# Patient Record
Sex: Female | Born: 1980 | Race: White | Hispanic: No | Marital: Single | Smoking: Never smoker
Health system: Southern US, Community
[De-identification: ages and names within clinical notes are randomized; demographics above are authoritative.]

## PROBLEM LIST (undated history)

## (undated) DIAGNOSIS — T7840XA Allergy, unspecified, initial encounter: Secondary | ICD-10-CM

## (undated) DIAGNOSIS — N809 Endometriosis, unspecified: Secondary | ICD-10-CM

## (undated) DIAGNOSIS — G35D Multiple sclerosis, unspecified: Secondary | ICD-10-CM

## (undated) DIAGNOSIS — G35 Multiple sclerosis: Secondary | ICD-10-CM

## (undated) HISTORY — DX: Allergy, unspecified, initial encounter: T78.40XA

## (undated) HISTORY — PX: LAPAROSCOPIC ENDOMETRIOSIS FULGURATION: SUR769

## (undated) HISTORY — DX: Endometriosis, unspecified: N80.9

## (undated) HISTORY — DX: Multiple sclerosis, unspecified: G35.D

## (undated) HISTORY — DX: Multiple sclerosis: G35

---

## 2006-05-26 ENCOUNTER — Emergency Department: Payer: Self-pay | Admitting: Emergency Medicine

## 2008-06-26 ENCOUNTER — Emergency Department: Payer: Self-pay | Admitting: Unknown Physician Specialty

## 2009-07-01 ENCOUNTER — Emergency Department: Payer: Self-pay | Admitting: Emergency Medicine

## 2009-09-12 ENCOUNTER — Ambulatory Visit: Payer: Self-pay | Admitting: Nephrology

## 2010-05-09 ENCOUNTER — Ambulatory Visit: Payer: Self-pay

## 2010-05-15 ENCOUNTER — Ambulatory Visit: Payer: Self-pay

## 2010-05-16 LAB — PATHOLOGY REPORT

## 2015-10-31 ENCOUNTER — Other Ambulatory Visit: Payer: Self-pay | Admitting: Family

## 2015-10-31 ENCOUNTER — Ambulatory Visit
Admission: RE | Admit: 2015-10-31 | Discharge: 2015-10-31 | Disposition: A | Discharge status: Home / Self Care | Payer: Self-pay | Source: Ambulatory Visit | Attending: Family | Admitting: Family

## 2015-10-31 ENCOUNTER — Ambulatory Visit
Admission: RE | Admit: 2015-10-31 | Discharge: 2015-10-31 | Disposition: A | Discharge status: Home / Self Care | Payer: Worker's Compensation | Source: Ambulatory Visit | Attending: Family | Admitting: Family

## 2015-10-31 DIAGNOSIS — M25571 Pain in right ankle and joints of right foot: Secondary | ICD-10-CM | POA: Diagnosis present

## 2015-10-31 DIAGNOSIS — R52 Pain, unspecified: Secondary | ICD-10-CM

## 2018-02-18 ENCOUNTER — Ambulatory Visit (INDEPENDENT_AMBULATORY_CARE_PROVIDER_SITE_OTHER): Payer: BLUE CROSS/BLUE SHIELD | Admitting: Family Medicine

## 2018-02-18 ENCOUNTER — Encounter: Payer: Self-pay | Admitting: Family Medicine

## 2018-02-18 ENCOUNTER — Other Ambulatory Visit: Payer: Self-pay

## 2018-02-18 VITALS — BP 92/62 | HR 69 | Temp 98.3°F | Ht 65.5 in | Wt 154.6 lb

## 2018-02-18 DIAGNOSIS — J0100 Acute maxillary sinusitis, unspecified: Secondary | ICD-10-CM

## 2018-02-18 DIAGNOSIS — J301 Allergic rhinitis due to pollen: Secondary | ICD-10-CM | POA: Diagnosis not present

## 2018-02-18 DIAGNOSIS — N809 Endometriosis, unspecified: Secondary | ICD-10-CM | POA: Diagnosis not present

## 2018-02-18 DIAGNOSIS — J309 Allergic rhinitis, unspecified: Secondary | ICD-10-CM | POA: Insufficient documentation

## 2018-02-18 DIAGNOSIS — Z7689 Persons encountering health services in other specified circumstances: Secondary | ICD-10-CM

## 2018-02-18 MED ORDER — NORGESTIMATE-ETH ESTRADIOL 0.25-35 MG-MCG PO TABS: 1.0000 | ORAL_TABLET | Freq: Every day | ORAL | 11 refills | Status: DC

## 2018-02-18 MED ORDER — AMOXICILLIN-POT CLAVULANATE 875-125 MG PO TABS: 1.0000 | ORAL_TABLET | Freq: Two times a day (BID) | ORAL | 0 refills | Status: DC

## 2018-02-18 NOTE — Patient Instructions (Signed)
Follow up for CPE 

## 2018-02-18 NOTE — Assessment & Plan Note (Signed)
Long-standing issue, having significant daily pain that sometimes causes her to miss work. Has tried medication management in the past without success. Hoping for a hysterectomy in the near future but not ready to see GYN yet. She knows to call when she's ready for that. Will start birth control pills in attempt to lessen severity in meantime. Risks and benefits reviewed

## 2018-02-18 NOTE — Assessment & Plan Note (Signed)
Stable on allegra during pollen season. Add flonase prn, continue current regimen

## 2018-02-18 NOTE — Progress Notes (Signed)
BP 92/62   Pulse 69   Temp 98.3 F (36.8 C) (Oral)   Ht 5' 5.5" (1.664 m)   Wt 154 lb 9.6 oz (70.1 kg)   SpO2 99%   BMI 25.34 kg/m    Subjective:    Patient ID: Desiree Chavez, female    DOB: 1981-04-06, 37 y.o.   MRN: [id number]  HPI: Desiree Chavez is a 37 y.o. female  Chief Complaint  Patient presents with  . New Patient (Initial Visit)  . Sore Throat    x 1 week/ pressure nose, headache, ears and jaw pain  . Cough   1 week of sore throat, facial pain and pressure, congestion, sinus headache, jaw pain, ear pain b/l. Several days ago felt like his face was swollen. Trying ibuprofen and tylenol alternating, alka seltzer cold and sinus, and prn allegra. No sick contacts, never smoker.   Hasn't had a physical in over 15 years. Last pap was in 2012.   Hx of endometriosis, dx'd in 2003. Tried on lupron and one other medication she can't recall. Not interested in IUD or nexplanon. Continues to have significant daily pain, sometimes to the point of N/V and missing work. Hoping for a hysterectomy soon.   Past Medical History:  Diagnosis Date  . Endometriosis    Social History   Socioeconomic History  . Marital status: Single    Spouse name: Not on file  . Number of children: Not on file  . Years of education: Not on file  . Highest education level: Not on file  Occupational History  . Not on file  Social Needs  . Financial resource strain: Not on file  . Food insecurity:    Worry: Not on file    Inability: Not on file  . Transportation needs:    Medical: Not on file    Non-medical: Not on file  Tobacco Use  . Smoking status: Never Smoker  . Smokeless tobacco: Never Used  Substance and Sexual Activity  . Alcohol use: Never    Frequency: Never  . Drug use: Never  . Sexual activity: Not Currently  Lifestyle  . Physical activity:    Days per week: Not on file    Minutes per session: Not on file  . Stress: Not on file  Relationships  . Social  connections:    Talks on phone: Not on file    Gets together: Not on file    Attends religious service: Not on file    Active member of club or organization: Not on file    Attends meetings of clubs or organizations: Not on file    Relationship status: Not on file  . Intimate partner violence:    Fear of current or ex partner: Not on file    Emotionally abused: Not on file    Physically abused: Not on file    Forced sexual activity: Not on file  Other Topics Concern  . Not on file  Social History Narrative  . Not on file    Relevant past medical, surgical, family and social history reviewed and updated as indicated. Interim medical history since our last visit reviewed. Allergies and medications reviewed and updated.  Review of Systems  Per HPI unless specifically indicated above     Objective:    BP 92/62   Pulse 69   Temp 98.3 F (36.8 C) (Oral)   Ht 5' 5.5" (1.664 m)   Wt 154 lb 9.6 oz (70.1 kg)  SpO2 99%   BMI 25.34 kg/m   Wt Readings from Last 3 Encounters:  02/18/18 154 lb 9.6 oz (70.1 kg)    Physical Exam  Constitutional: She is oriented to person, place, and time. She appears well-developed and well-nourished. No distress.  HENT:  Head: Atraumatic.  Right Ear: External ear normal.  Left Ear: External ear normal.  Oropharynx and nasal mucosa erythematous with drainage present B/l maxillary sinuses ttp  Eyes: Conjunctivae and EOM are normal.  Neck: Normal range of motion. Neck supple.  Cardiovascular: Normal rate and regular rhythm.  Pulmonary/Chest: Effort normal and breath sounds normal.  Musculoskeletal: Normal range of motion.  Neurological: She is alert and oriented to person, place, and time.  Skin: Skin is warm and dry.  Psychiatric: She has a normal mood and affect. Her behavior is normal.  Nursing note and vitals reviewed.   No results found for this or any previous visit.    Assessment & Plan:   Problem List Items Addressed This Visit        Respiratory   Allergic rhinitis - Primary    Stable on allegra during pollen season. Add flonase prn, continue current regimen        Other   Endometriosis    Long-standing issue, having significant daily pain that sometimes causes her to miss work. Has tried medication management in the past without success. Hoping for a hysterectomy in the near future but not ready to see GYN yet. She knows to call when she's ready for that. Will start birth control pills in attempt to lessen severity in meantime. Risks and benefits reviewed       Other Visit Diagnoses    Encounter to establish care       Acute maxillary sinusitis, recurrence not specified       Start augmentin, flonase, mucinex, sinus rinses. F/u if no improvement   Relevant Medications   Fexofenadine HCl (ALLEGRA PO)   amoxicillin-clavulanate (AUGMENTIN) 875-125 MG tablet       Follow up plan: Return for CPE.

## 2018-03-23 ENCOUNTER — Ambulatory Visit (INDEPENDENT_AMBULATORY_CARE_PROVIDER_SITE_OTHER): Payer: BLUE CROSS/BLUE SHIELD | Admitting: Family Medicine

## 2018-03-23 ENCOUNTER — Other Ambulatory Visit (HOSPITAL_COMMUNITY)
Admission: RE | Admit: 2018-03-23 | Discharge: 2018-03-23 | Disposition: A | Discharge status: Home / Self Care | Payer: BLUE CROSS/BLUE SHIELD | Source: Ambulatory Visit | Attending: Family Medicine | Admitting: Family Medicine

## 2018-03-23 ENCOUNTER — Encounter: Payer: Self-pay | Admitting: Family Medicine

## 2018-03-23 ENCOUNTER — Other Ambulatory Visit: Payer: Self-pay

## 2018-03-23 VITALS — BP 91/62 | HR 69 | Temp 97.9°F | Ht 66.0 in | Wt 153.5 lb

## 2018-03-23 DIAGNOSIS — Z0000 Encounter for general adult medical examination without abnormal findings: Secondary | ICD-10-CM

## 2018-03-23 DIAGNOSIS — Z87898 Personal history of other specified conditions: Secondary | ICD-10-CM | POA: Diagnosis not present

## 2018-03-23 DIAGNOSIS — Z124 Encounter for screening for malignant neoplasm of cervix: Secondary | ICD-10-CM | POA: Insufficient documentation

## 2018-03-23 DIAGNOSIS — R202 Paresthesia of skin: Secondary | ICD-10-CM

## 2018-03-23 DIAGNOSIS — N809 Endometriosis, unspecified: Secondary | ICD-10-CM | POA: Diagnosis not present

## 2018-03-23 DIAGNOSIS — Z23 Encounter for immunization: Secondary | ICD-10-CM

## 2018-03-23 DIAGNOSIS — L821 Other seborrheic keratosis: Secondary | ICD-10-CM | POA: Diagnosis not present

## 2018-03-23 DIAGNOSIS — Z8742 Personal history of other diseases of the female genital tract: Secondary | ICD-10-CM

## 2018-03-23 LAB — UA/M W/RFLX CULTURE, ROUTINE
BILIRUBIN UA: NEGATIVE
GLUCOSE, UA: NEGATIVE
Ketones, UA: NEGATIVE
LEUKOCYTES UA: NEGATIVE
Nitrite, UA: NEGATIVE
PROTEIN UA: NEGATIVE
RBC, UA: NEGATIVE
Specific Gravity, UA: 1.02 (ref 1.005–1.030)
Urobilinogen, Ur: 0.2 mg/dL (ref 0.2–1.0)
pH, UA: 6 (ref 5.0–7.5)

## 2018-03-23 NOTE — Progress Notes (Signed)
BP 91/62   Pulse 69   Temp 97.9 F (36.6 C) (Oral)   Ht 5\' 6"  (1.676 m)   Wt 153 lb 8 oz (69.6 kg)   SpO2 99%   BMI 24.78 kg/m    Subjective:    Patient ID: Desiree Chavez, female    DOB: 1980-12-14, 37 y.o.   MRN: [id number]  HPI: ARAIYA Chavez is a 37 y.o. female presenting on 03/23/2018 for comprehensive medical examination. Current medical complaints include:see below  Several years of right sided numbness and burning that she states goes from face to toes. Nothing seems to make better or worse. States water hitting her skin on that side burns. No known injuries or Neurologic issues.   Also has a skin lesion on right upper back that she is worried about. Has been peeling off and bleeding and itching some lately as well as growing larger.   Notes improvement in endometriosis pain with addition of birth control pills. No concerns there.   Hx of abnormal pap smears x 2. Last pap was in 2012 per patient and abnormal, states she was not told to follow up further about this and that she quickly lost insurance so was lost to follow up.   Depression Screen done today and results listed below:  Depression screen [facility] 2/9 03/23/2018 02/18/2018  Decreased Interest 0 0  Down, Depressed, Hopeless 0 0  PHQ - 2 Score 0 0  Altered sleeping 0 0  Tired, decreased energy 0 0  Change in appetite 0 0  Feeling bad or failure about yourself  0 0  Trouble concentrating 0 0  Moving slowly or fidgety/restless 0 0  Suicidal thoughts 0 0  PHQ-9 Score 0 0    The patient does not have a history of falls. I did not complete a risk assessment for falls. A plan of care for falls was not documented.   Past Medical History:  Past Medical History:  Diagnosis Date  . Endometriosis     Surgical History:  Past Surgical History:  Procedure Laterality Date  . LAPAROSCOPIC ENDOMETRIOSIS FULGURATION N/A     Medications:  Current Outpatient Medications on File Prior to Visit  Medication Sig    . Fexofenadine HCl (ALLEGRA PO) Take by mouth as needed.  Marland Kitchen ibuprofen (ADVIL,MOTRIN) 600 MG tablet Take by mouth.  . norgestimate-ethinyl estradiol (ORTHO-CYCLEN, 28,) 0.25-35 MG-MCG tablet Take 1 tablet by mouth daily.   No current facility-administered medications on file prior to visit.     Allergies:  Allergies  Allergen Reactions  . Codeine Rash    Social History:  Social History   Socioeconomic History  . Marital status: Single    Spouse name: Not on file  . Number of children: Not on file  . Years of education: Not on file  . Highest education level: Not on file  Occupational History  . Not on file  Social Needs  . Financial resource strain: Not on file  . Food insecurity:    Worry: Not on file    Inability: Not on file  . Transportation needs:    Medical: Not on file    Non-medical: Not on file  Tobacco Use  . Smoking status: Never Smoker  . Smokeless tobacco: Never Used  Substance and Sexual Activity  . Alcohol use: Never    Frequency: Never  . Drug use: Never  . Sexual activity: Not Currently  Lifestyle  . Physical activity:    Days per week: Not  on file    Minutes per session: Not on file  . Stress: Not on file  Relationships  . Social connections:    Talks on phone: Not on file    Gets together: Not on file    Attends religious service: Not on file    Active member of club or organization: Not on file    Attends meetings of clubs or organizations: Not on file    Relationship status: Not on file  . Intimate partner violence:    Fear of current or ex partner: Not on file    Emotionally abused: Not on file    Physically abused: Not on file    Forced sexual activity: Not on file  Other Topics Concern  . Not on file  Social History Narrative  . Not on file   Social History   Tobacco Use  Smoking Status Never Smoker  Smokeless Tobacco Never Used   Social History   Substance and Sexual Activity  Alcohol Use Never  . Frequency: Never     Family History:  Family History  Problem Relation Age of Onset  . Diabetes Mother   . Hypertension Mother   . Hyperlipidemia Mother   . Depression Mother   . Hypertension Father   . Hyperlipidemia Father   . Depression Father   . Stroke Father   . Bipolar disorder Father   . Bartter's syndrome Sister   . Heart disease Maternal Grandmother   . COPD Maternal Grandmother   . Diabetes Maternal Grandmother   . Cancer Maternal Grandfather     Past medical history, surgical history, medications, allergies, family history and social history reviewed with patient today and changes made to appropriate areas of the chart.   Review of Systems - General ROS: negative Psychological ROS: negative Ophthalmic ROS: negative ENT ROS: negative Allergy and Immunology ROS: negative Hematological and Lymphatic ROS: negative Endocrine ROS: negative Breast ROS: negative for breast lumps Respiratory ROS: no cough, shortness of breath, or wheezing Cardiovascular ROS: no chest pain or dyspnea on exertion Gastrointestinal ROS: no abdominal pain, change in bowel habits, or black or bloody stools Genito-Urinary ROS: no dysuria, trouble voiding, or hematuria Musculoskeletal ROS: negative Neurological ROS: positive for - numbness/tingling Dermatological ROS: positive for skin lesion changes All other ROS negative except what is listed above and in the HPI.      Objective:    BP 91/62   Pulse 69   Temp 97.9 F (36.6 C) (Oral)   Ht 5\' 6"  (1.676 m)   Wt 153 lb 8 oz (69.6 kg)   SpO2 99%   BMI 24.78 kg/m   Wt Readings from Last 3 Encounters:  03/23/18 153 lb 8 oz (69.6 kg)  02/18/18 154 lb 9.6 oz (70.1 kg)    Physical Exam  Constitutional: She is oriented to person, place, and time. She appears well-developed and well-nourished. No distress.  HENT:  Head: Atraumatic.  Right Ear: External ear normal.  Left Ear: External ear normal.  Nose: Nose normal.  Mouth/Throat: Oropharynx is clear  and moist. No oropharyngeal exudate.  Eyes: Pupils are equal, round, and reactive to light. Conjunctivae are normal. No scleral icterus.  Neck: Normal range of motion. Neck supple. No thyromegaly present.  Cardiovascular: Normal rate, regular rhythm, normal heart sounds and intact distal pulses.  Pulmonary/Chest: Effort normal and breath sounds normal. No respiratory distress. Right breast exhibits no mass, no nipple discharge, no skin change and no tenderness. Left breast exhibits no mass, no  nipple discharge, no skin change and no tenderness.  Abdominal: Soft. Bowel sounds are normal. She exhibits no mass. There is no tenderness.  Genitourinary:  Genitourinary Comments: Cervix minimally erythematous Vaginal mucosa benign No discharge present  Musculoskeletal: Normal range of motion. She exhibits no edema or tenderness.  Strength full and equal b/l UE/LE  Lymphadenopathy:    She has no cervical adenopathy.    She has no axillary adenopathy.  Neurological: She is alert and oriented to person, place, and time. She displays normal reflexes. A sensory deficit (right face, UE, LE decreased sensation to light touch) is present. She exhibits normal muscle tone.  Skin: Skin is warm and dry. No rash noted.  Seborrheic keratosis right upper back, some evidence of recent bleeding and irritation but otherwise benign  Psychiatric: She has a normal mood and affect. Her behavior is normal.  Nursing note and vitals reviewed.   Results for orders placed or performed in visit on 03/23/18  CBC with Differential/Platelet  Result Value Ref Range   WBC 5.2 3.4 - 10.8 x10E3/uL   RBC 4.16 3.77 - 5.28 x10E6/uL   Hemoglobin 11.9 11.1 - 15.9 g/dL   Hematocrit 37.1 34.0 - 46.6 %   MCV 89 79 - 97 fL   MCH 28.6 26.6 - 33.0 pg   MCHC 32.1 31.5 - 35.7 g/dL   RDW 12.4 12.3 - 15.4 %   Platelets 237 150 - 450 x10E3/uL   Neutrophils 63 Not Estab. %   Lymphs 28 Not Estab. %   Monocytes 6 Not Estab. %   Eos 2 Not  Estab. %   Basos 1 Not Estab. %   Neutrophils Absolute 3.3 1.4 - 7.0 x10E3/uL   Lymphocytes Absolute 1.5 0.7 - 3.1 x10E3/uL   Monocytes Absolute 0.3 0.1 - 0.9 x10E3/uL   EOS (ABSOLUTE) 0.1 0.0 - 0.4 x10E3/uL   Basophils Absolute 0.0 0.0 - 0.2 x10E3/uL   Immature Granulocytes 0 Not Estab. %   Immature Grans (Abs) 0.0 0.0 - 0.1 x10E3/uL  Comprehensive metabolic panel  Result Value Ref Range   Glucose 98 65 - 99 mg/dL   BUN 8 6 - 20 mg/dL   Creatinine, Ser 0.85 0.57 - 1.00 mg/dL   GFR calc non Af Amer 87 >59 mL/min/1.73   GFR calc Af Amer 100 >59 mL/min/1.73   BUN/Creatinine Ratio 9 9 - 23   Sodium 139 134 - 144 mmol/L   Potassium 4.1 3.5 - 5.2 mmol/L   Chloride 102 96 - 106 mmol/L   CO2 24 20 - 29 mmol/L   Calcium 9.3 8.7 - 10.2 mg/dL   Total Protein 7.4 6.0 - 8.5 g/dL   Albumin 4.5 3.5 - 5.5 g/dL   Globulin, Total 2.9 1.5 - 4.5 g/dL   Albumin/Globulin Ratio 1.6 1.2 - 2.2   Bilirubin Total 0.9 0.0 - 1.2 mg/dL   Alkaline Phosphatase 31 (L) 39 - 117 IU/L   AST 14 0 - 40 IU/L   ALT 11 0 - 32 IU/L  Lipid Panel w/o Chol/HDL Ratio  Result Value Ref Range   Cholesterol, Total 162 100 - 199 mg/dL   Triglycerides 85 0 - 149 mg/dL   HDL 55 >39 mg/dL   VLDL Cholesterol Cal 17 5 - 40 mg/dL   LDL Calculated 90 0 - 99 mg/dL  TSH  Result Value Ref Range   TSH 1.090 0.450 - 4.500 uIU/mL  UA/M w/rflx Culture, Routine  Result Value Ref Range   Specific Gravity, UA 1.020  1.005 - 1.030   pH, UA 6.0 5.0 - 7.5   Color, UA Yellow Yellow   Appearance Ur Cloudy (A) Clear   Leukocytes, UA Negative Negative   Protein, UA Negative Negative/Trace   Glucose, UA Negative Negative   Ketones, UA Negative Negative   RBC, UA Negative Negative   Bilirubin, UA Negative Negative   Urobilinogen, Ur 0.2 0.2 - 1.0 mg/dL   Nitrite, UA Negative Negative      Assessment & Plan:   Problem List Items Addressed This Visit      Other   Endometriosis    Improved with oral contraceptives. Continue current  regimen       Other Visit Diagnoses    Paresthesia    -  Primary   Will refer to Neurology for further workup. Ongoing issue for several years   Relevant Orders   Ambulatory referral to Neurology   Annual physical exam       Relevant Orders   CBC with Differential/Platelet (Completed)   Comprehensive metabolic panel (Completed)   Lipid Panel w/o Chol/HDL Ratio (Completed)   TSH (Completed)   UA/M w/rflx Culture, Routine (Completed)   Flu vaccine need       Relevant Orders   Flu Vaccine QUAD 36+ mos IM (Completed)   Screening for cervical cancer       Relevant Orders   Cytology - PAP   History of abnormal cervical Pap smear       Relevant Orders   Cytology - PAP   Seborrheic keratosis       Reassurance given about benign nature. Options of cryotherapy vs shave excision given if bothersome. Pt will leave it alone for now       Follow up plan: Return in about 1 year (around 03/24/2019) for CPE.   LABORATORY TESTING:  - Pap smear: pap done  IMMUNIZATIONS:   - Tdap: Tetanus vaccination status reviewed: will get at next CPE. - Influenza: Administered today  PATIENT COUNSELING:   Advised to take 1 mg of folate supplement per day if capable of pregnancy.   Sexuality: Discussed sexually transmitted diseases, partner selection, use of condoms, avoidance of unintended pregnancy  and contraceptive alternatives.   Advised to avoid cigarette smoking.  I discussed with the patient that most people either abstain from alcohol or drink within safe limits (<=14/week and <=4 drinks/occasion for males, <=7/weeks and <= 3 drinks/occasion for females) and that the risk for alcohol disorders and other health effects rises proportionally with the number of drinks per week and how often a drinker exceeds daily limits.  Discussed cessation/primary prevention of drug use and availability of treatment for abuse.   Diet: Encouraged to adjust caloric intake to maintain  or achieve ideal body  weight, to reduce intake of dietary saturated fat and total fat, to limit sodium intake by avoiding high sodium foods and not adding table salt, and to maintain adequate dietary potassium and calcium preferably from fresh fruits, vegetables, and low-fat dairy products.    stressed the importance of regular exercise  Injury prevention: Discussed safety belts, safety helmets, smoke detector, smoking near bedding or upholstery.   Dental health: Discussed importance of regular tooth brushing, flossing, and dental visits.    NEXT PREVENTATIVE PHYSICAL DUE IN 1 YEAR. Return in about 1 year (around 03/24/2019) for CPE.

## 2018-03-24 LAB — CBC WITH DIFFERENTIAL/PLATELET
BASOS ABS: 0 10*3/uL (ref 0.0–0.2)
Basos: 1 %
EOS (ABSOLUTE): 0.1 10*3/uL (ref 0.0–0.4)
Eos: 2 %
HEMOGLOBIN: 11.9 g/dL (ref 11.1–15.9)
Hematocrit: 37.1 % (ref 34.0–46.6)
IMMATURE GRANS (ABS): 0 10*3/uL (ref 0.0–0.1)
Immature Granulocytes: 0 %
Lymphocytes Absolute: 1.5 10*3/uL (ref 0.7–3.1)
Lymphs: 28 %
MCH: 28.6 pg (ref 26.6–33.0)
MCHC: 32.1 g/dL (ref 31.5–35.7)
MCV: 89 fL (ref 79–97)
MONOCYTES: 6 %
Monocytes Absolute: 0.3 10*3/uL (ref 0.1–0.9)
Neutrophils Absolute: 3.3 10*3/uL (ref 1.4–7.0)
Neutrophils: 63 %
Platelets: 237 10*3/uL (ref 150–450)
RBC: 4.16 x10E6/uL (ref 3.77–5.28)
RDW: 12.4 % (ref 12.3–15.4)
WBC: 5.2 10*3/uL (ref 3.4–10.8)

## 2018-03-24 LAB — LIPID PANEL W/O CHOL/HDL RATIO
CHOLESTEROL TOTAL: 162 mg/dL (ref 100–199)
HDL: 55 mg/dL (ref >39)
LDL Calculated: 90 mg/dL (ref 0–99)
Triglycerides: 85 mg/dL (ref 0–149)
VLDL CHOLESTEROL CAL: 17 mg/dL (ref 5–40)

## 2018-03-24 LAB — COMPREHENSIVE METABOLIC PANEL
ALBUMIN: 4.5 g/dL (ref 3.5–5.5)
ALK PHOS: 31 IU/L — AB (ref 39–117)
ALT: 11 IU/L (ref 0–32)
AST: 14 IU/L (ref 0–40)
Albumin/Globulin Ratio: 1.6 (ref 1.2–2.2)
BUN / CREAT RATIO: 9 (ref 9–23)
BUN: 8 mg/dL (ref 6–20)
Bilirubin Total: 0.9 mg/dL (ref 0.0–1.2)
CO2: 24 mmol/L (ref 20–29)
Calcium: 9.3 mg/dL (ref 8.7–10.2)
Chloride: 102 mmol/L (ref 96–106)
Creatinine, Ser: 0.85 mg/dL (ref 0.57–1.00)
GFR calc Af Amer: 100 mL/min/{1.73_m2} (ref >59)
GFR calc non Af Amer: 87 mL/min/{1.73_m2} (ref >59)
GLUCOSE: 98 mg/dL (ref 65–99)
Globulin, Total: 2.9 g/dL (ref 1.5–4.5)
Potassium: 4.1 mmol/L (ref 3.5–5.2)
Sodium: 139 mmol/L (ref 134–144)
TOTAL PROTEIN: 7.4 g/dL (ref 6.0–8.5)

## 2018-03-24 LAB — TSH: TSH: 1.09 u[IU]/mL (ref 0.450–4.500)

## 2018-03-25 NOTE — Assessment & Plan Note (Signed)
Improved with oral contraceptives. Continue current regimen

## 2018-03-25 NOTE — Patient Instructions (Signed)
Follow up in 1 year.

## 2018-03-28 ENCOUNTER — Telehealth: Payer: Self-pay | Admitting: Family Medicine

## 2018-03-28 LAB — CYTOLOGY - PAP
Diagnosis: NEGATIVE
HPV (WINDOPATH): NOT DETECTED

## 2018-03-28 MED ORDER — AMOXICILLIN-POT CLAVULANATE 875-125 MG PO TABS: 1.0000 | ORAL_TABLET | Freq: Two times a day (BID) | ORAL | 0 refills | Status: DC

## 2018-03-28 NOTE — Telephone Encounter (Signed)
Rx sent  Copied from [place] [id number]. Topic: General - Other >> Mar 28, 2018  2:48 PM Yvette Rack wrote: Reason for CRM: pt Mother Izora Gala calling to see if [facility] can write a RX for amoxicillin-clavulanate (AUGMENTIN) [phone] she states that she has the same symptoms as she did on 02-18-18 with facial pain with her face and nose hurting and facial swelling with headaches please call mother Izora Gala at [facility] [address] ([address]), [place] [phone] (Phone) [phone] (Fax)

## 2018-03-29 NOTE — Telephone Encounter (Signed)
VM left for mother Desiree Chavez. DPR reviewed.

## 2018-03-31 DIAGNOSIS — R52 Pain, unspecified: Secondary | ICD-10-CM | POA: Insufficient documentation

## 2018-03-31 DIAGNOSIS — R200 Anesthesia of skin: Secondary | ICD-10-CM | POA: Diagnosis not present

## 2018-03-31 DIAGNOSIS — R202 Paresthesia of skin: Secondary | ICD-10-CM | POA: Diagnosis not present

## 2018-04-04 ENCOUNTER — Ambulatory Visit: Payer: BLUE CROSS/BLUE SHIELD | Admitting: Family Medicine

## 2018-06-08 ENCOUNTER — Ambulatory Visit: Payer: BLUE CROSS/BLUE SHIELD | Admitting: Family Medicine

## 2018-08-17 ENCOUNTER — Emergency Department
Admission: EM | Admit: 2018-08-17 | Discharge: 2018-08-17 | Disposition: A | Discharge status: Home / Self Care | Payer: BLUE CROSS/BLUE SHIELD | Attending: Emergency Medicine | Admitting: Emergency Medicine

## 2018-08-17 ENCOUNTER — Other Ambulatory Visit: Payer: Self-pay

## 2018-08-17 ENCOUNTER — Encounter: Payer: Self-pay | Admitting: Emergency Medicine

## 2018-08-17 DIAGNOSIS — Z711 Person with feared health complaint in whom no diagnosis is made: Secondary | ICD-10-CM | POA: Diagnosis not present

## 2018-08-17 DIAGNOSIS — M21372 Foot drop, left foot: Secondary | ICD-10-CM | POA: Diagnosis not present

## 2018-08-17 DIAGNOSIS — R4701 Aphasia: Secondary | ICD-10-CM | POA: Diagnosis not present

## 2018-08-17 DIAGNOSIS — M6281 Muscle weakness (generalized): Secondary | ICD-10-CM | POA: Diagnosis not present

## 2018-08-17 DIAGNOSIS — R4781 Slurred speech: Secondary | ICD-10-CM | POA: Diagnosis not present

## 2018-08-17 NOTE — ED Provider Notes (Signed)
[facility] Emergency Department Provider Note   ____________________________________________   First MD Initiated Contact with Patient 08/17/18 1109     (approximate)  I have reviewed the triage vital signs and the nursing notes.   HISTORY  Chief Complaint Foreign Body in Vagina   HPI Desiree Chavez is a 38 y.o. female presents to the ED thinking that she has a tampon in her vagina that she is unable to remove.  Patient states that she put 1 AM at 7 AM and states she is unable to find the string to remove it.   Denies any pain.   Past Medical History:  Diagnosis Date  . Endometriosis     Patient Active Problem List   Diagnosis Date Noted  . Allergic rhinitis 02/18/2018  . Endometriosis 02/18/2018    Past Surgical History:  Procedure Laterality Date  . LAPAROSCOPIC ENDOMETRIOSIS FULGURATION N/A     Prior to Admission medications   Medication Sig Start Date End Date Taking? Authorizing Provider  amoxicillin-clavulanate (AUGMENTIN) 875-125 MG tablet Take 1 tablet by mouth 2 (two) times daily. 03/28/18   Volney American, PA-C  Fexofenadine HCl (ALLEGRA PO) Take by mouth as needed.    [provider]  ibuprofen (ADVIL,MOTRIN) 600 MG tablet Take by mouth.    [provider]  norgestimate-ethinyl estradiol (ORTHO-CYCLEN, 28,) 0.25-35 MG-MCG tablet Take 1 tablet by mouth daily. 02/18/18   Volney American, PA-C    Allergies Codeine  Family History  Problem Relation Age of Onset  . Diabetes Mother   . Hypertension Mother   . Hyperlipidemia Mother   . Depression Mother   . Hypertension Father   . Hyperlipidemia Father   . Depression Father   . Stroke Father   . Bipolar disorder Father   . Bartter's syndrome Sister   . Heart disease Maternal Grandmother   . COPD Maternal Grandmother   . Diabetes Maternal Grandmother   . Cancer Maternal Grandfather     Social History Social History   Tobacco Use  .  Smoking status: Never Smoker  . Smokeless tobacco: Never Used  Substance Use Topics  . Alcohol use: Never    Frequency: Never  . Drug use: Never    Review of Systems Constitutional: No fever/chills Cardiovascular: Denies chest pain. Respiratory: Denies shortness of breath. Genitourinary: Possible foreign body vagina. Skin: Negative for rash. Neurological: Negative for focal weakness or numbness. ___________________________________________   PHYSICAL EXAM:  VITAL SIGNS: ED Triage Vitals  Enc Vitals Group     BP 08/17/18 1044 114/69     Pulse Rate 08/17/18 1044 (!) 103     Resp 08/17/18 1044 20     Temp 08/17/18 1044 98.3 F (36.8 C)     Temp Source 08/17/18 1044 Oral     SpO2 08/17/18 1044 100 %     Weight 08/17/18 1045 162 lb (73.5 kg)     Height 08/17/18 1045 5\' 6"  (1.676 m)     Head Circumference --      Peak Flow --      Pain Score 08/17/18 1045 0     Pain Loc --      Pain Edu? --      Excl. in [place]? --    Constitutional: Alert and oriented. Well appearing and in no acute distress.  Tearful. Eyes: Conjunctivae are normal.  Head: Atraumatic. Neck: No stridor.   Cardiovascular: Normal rate, regular rhythm. Grossly normal heart sounds.  Good peripheral circulation. Respiratory:  Normal respiratory effort.  No retractions. Lungs CTAB. Gastrointestinal: Soft and nontender. No distention. Genitourinary: Normal external genitalia.  Vaginal exam no foreign body was noted.  Vaginal blood was removed from the area and a third visual exam was done.  Bimanual exam was without foreign body and no adnexal masses or tenderness was noted. Musculoskeletal: No lower extremity tenderness nor edema.  No joint effusions. Neurologic:  Normal speech and language. No gross focal neurologic deficits are appreciated. No gait instability. Skin:  Skin is warm, dry and intact. No rash noted. Psychiatric: Mood and affect are normal. Speech and behavior are  normal.  ____________________________________________   LABS (all labs ordered are listed, but only abnormal results are displayed)  Labs Reviewed - No data to display   PROCEDURES  Procedure(s) performed: None  Procedures  Critical Care performed: No  ____________________________________________   INITIAL IMPRESSION / ASSESSMENT AND PLAN / ED COURSE  As part of my medical decision making, I reviewed the following data within the electronic MEDICAL RECORD NUMBER Notes from prior ED visits and [place] Controlled Substance Database  Patient presents to the ED with concerns that she is unable to get a tampon out that she placed at 7 AM this morning.  On physical exam there was no tampon noted in the vaginal vault.  No foreign body was noted with bimanual exam.  Patient was reassured.  Patient was discharged with reassurance.   ____________________________________________   FINAL CLINICAL IMPRESSION(S) / ED DIAGNOSES  Final diagnoses:  Feared condition not demonstrated     ED Discharge Orders    None       Note:  This document was prepared using Dragon voice recognition software and may include unintentional dictation errors.    Johnn Hai, PA-C 08/17/18 [place], [place], MD 08/17/18 [phone]

## 2018-08-17 NOTE — Discharge Instructions (Addendum)
No foreign body was noted during the exam.  Follow-up with your primary care provider if any continued problems or concerns.

## 2018-08-17 NOTE — ED Triage Notes (Signed)
Patient has tampon in vagina inserted at 0700 this AM ,states she cannot remove it.

## 2018-08-18 ENCOUNTER — Emergency Department: Payer: Self-pay

## 2018-08-18 ENCOUNTER — Other Ambulatory Visit: Payer: Self-pay

## 2018-08-18 ENCOUNTER — Inpatient Hospital Stay
Admission: EM | Admit: 2018-08-18 | Discharge: 2018-08-23 | DRG: 059 | Disposition: A | Discharge status: Home / Self Care | Payer: Self-pay | Attending: Internal Medicine | Admitting: Internal Medicine

## 2018-08-18 DIAGNOSIS — M21372 Foot drop, left foot: Secondary | ICD-10-CM | POA: Diagnosis present

## 2018-08-18 DIAGNOSIS — R471 Dysarthria and anarthria: Secondary | ICD-10-CM | POA: Diagnosis present

## 2018-08-18 DIAGNOSIS — Z885 Allergy status to narcotic agent status: Secondary | ICD-10-CM

## 2018-08-18 DIAGNOSIS — Z820 Family history of epilepsy and other diseases of the nervous system: Secondary | ICD-10-CM

## 2018-08-18 DIAGNOSIS — G35 Multiple sclerosis: Principal | ICD-10-CM | POA: Diagnosis present

## 2018-08-18 DIAGNOSIS — R4781 Slurred speech: Secondary | ICD-10-CM | POA: Diagnosis not present

## 2018-08-18 DIAGNOSIS — Z833 Family history of diabetes mellitus: Secondary | ICD-10-CM

## 2018-08-18 DIAGNOSIS — R4701 Aphasia: Secondary | ICD-10-CM | POA: Diagnosis present

## 2018-08-18 DIAGNOSIS — G8314 Monoplegia of lower limb affecting left nondominant side: Secondary | ICD-10-CM | POA: Diagnosis present

## 2018-08-18 DIAGNOSIS — Z823 Family history of stroke: Secondary | ICD-10-CM

## 2018-08-18 DIAGNOSIS — Z818 Family history of other mental and behavioral disorders: Secondary | ICD-10-CM

## 2018-08-18 DIAGNOSIS — J309 Allergic rhinitis, unspecified: Secondary | ICD-10-CM | POA: Diagnosis present

## 2018-08-18 DIAGNOSIS — R29898 Other symptoms and signs involving the musculoskeletal system: Secondary | ICD-10-CM

## 2018-08-18 DIAGNOSIS — M6281 Muscle weakness (generalized): Secondary | ICD-10-CM | POA: Diagnosis not present

## 2018-08-18 DIAGNOSIS — Z79899 Other long term (current) drug therapy: Secondary | ICD-10-CM

## 2018-08-18 DIAGNOSIS — Z793 Long term (current) use of hormonal contraceptives: Secondary | ICD-10-CM

## 2018-08-18 DIAGNOSIS — E876 Hypokalemia: Secondary | ICD-10-CM | POA: Diagnosis present

## 2018-08-18 DIAGNOSIS — Z8249 Family history of ischemic heart disease and other diseases of the circulatory system: Secondary | ICD-10-CM

## 2018-08-18 DIAGNOSIS — Z8349 Family history of other endocrine, nutritional and metabolic diseases: Secondary | ICD-10-CM

## 2018-08-18 LAB — CBC
HCT: 37.7 % (ref 36.0–46.0)
Hemoglobin: 12.5 g/dL (ref 12.0–15.0)
MCH: 29.1 pg (ref 26.0–34.0)
MCHC: 33.2 g/dL (ref 30.0–36.0)
MCV: 87.7 fL (ref 80.0–100.0)
PLATELETS: 208 10*3/uL (ref 150–400)
RBC: 4.3 MIL/uL (ref 3.87–5.11)
RDW: 12.6 % (ref 11.5–15.5)
WBC: 6.3 10*3/uL (ref 4.0–10.5)
nRBC: 0 % (ref 0.0–0.2)

## 2018-08-18 LAB — COMPREHENSIVE METABOLIC PANEL
ALT: 18 U/L (ref 0–44)
AST: 23 U/L (ref 15–41)
Albumin: 4.4 g/dL (ref 3.5–5.0)
Alkaline Phosphatase: 23 U/L — ABNORMAL LOW (ref 38–126)
Anion gap: 9 (ref 5–15)
BUN: 10 mg/dL (ref 6–20)
CO2: 24 mmol/L (ref 22–32)
Calcium: 9 mg/dL (ref 8.9–10.3)
Chloride: 104 mmol/L (ref 98–111)
Creatinine, Ser: 0.62 mg/dL (ref 0.44–1.00)
GFR calc Af Amer: >60 mL/min (ref >60)
GFR calc non Af Amer: >60 mL/min (ref >60)
Glucose, Bld: 107 mg/dL — ABNORMAL HIGH (ref 70–99)
POTASSIUM: 3.4 mmol/L — AB (ref 3.5–5.1)
Sodium: 137 mmol/L (ref 135–145)
Total Bilirubin: 1.4 mg/dL — ABNORMAL HIGH (ref 0.3–1.2)
Total Protein: 7.8 g/dL (ref 6.5–8.1)

## 2018-08-18 LAB — APTT: aPTT: 28 seconds (ref 24–36)

## 2018-08-18 LAB — DIFFERENTIAL
Abs Immature Granulocytes: 0.01 10*3/uL (ref 0.00–0.07)
Basophils Absolute: 0 10*3/uL (ref 0.0–0.1)
Basophils Relative: 1 %
EOS ABS: 0.1 10*3/uL (ref 0.0–0.5)
Eosinophils Relative: 2 %
Immature Granulocytes: 0 %
Lymphocytes Relative: 26 %
Lymphs Abs: 1.6 10*3/uL (ref 0.7–4.0)
Monocytes Absolute: 0.4 10*3/uL (ref 0.1–1.0)
Monocytes Relative: 6 %
Neutro Abs: 4.2 10*3/uL (ref 1.7–7.7)
Neutrophils Relative %: 65 %

## 2018-08-18 LAB — TROPONIN I: Troponin I: <0.03 ng/mL (ref <0.03)

## 2018-08-18 LAB — PROTIME-INR
INR: 0.99
Prothrombin Time: 13 seconds (ref 11.4–15.2)

## 2018-08-18 LAB — ETHANOL: Alcohol, Ethyl (B): <10 mg/dL (ref <10)

## 2018-08-18 MED ORDER — SODIUM CHLORIDE 0.9% FLUSH: 3.0000 mL | Freq: Once | INTRAVENOUS | Status: DC

## 2018-08-18 NOTE — ED Notes (Signed)
Awaiting neurology consult at this time.

## 2018-08-18 NOTE — ED Notes (Signed)
MD at bedside at this time.

## 2018-08-18 NOTE — ED Provider Notes (Signed)
[facility] Emergency Department Provider Note  ____________________________________________  Time seen: Approximately 11:13 PM  I have reviewed the triage vital signs and the nursing notes.   HISTORY  Chief Complaint Aphasia (prior to going to bed last night )    HPI Desiree Chavez is a 38 y.o. female, otherwise healthy, presenting with slurred speech, expressive aphasia, and left leg weakness.  The patient reports that yesterday she noticed that she was having slurred speech but figured that it would go away.  Today at work, it got worse and she noticed that she had trouble lifting her left leg, and was having a foot drop on the left.  She has not had any headache, trauma, blurred or double vision, numbness tingling or weakness.  SH: Denies tobacco or cocaine. FH: Aunt with multiple sclerosis.  Father with multiple CVAs.  Past Medical History:  Diagnosis Date  . Endometriosis     Patient Active Problem List   Diagnosis Date Noted  . Allergic rhinitis 02/18/2018  . Endometriosis 02/18/2018    Past Surgical History:  Procedure Laterality Date  . LAPAROSCOPIC ENDOMETRIOSIS FULGURATION N/A     Current Outpatient Rx  . Order #: [id number] Class: Normal  . Order #: [id number] Class: Historical Med  . Order #: [id number] Class: Historical Med  . Order #: [id number] Class: Normal    Allergies Codeine  Family History  Problem Relation Age of Onset  . Diabetes Mother   . Hypertension Mother   . Hyperlipidemia Mother   . Depression Mother   . Hypertension Father   . Hyperlipidemia Father   . Depression Father   . Stroke Father   . Bipolar disorder Father   . Bartter's syndrome Sister   . Heart disease Maternal Grandmother   . COPD Maternal Grandmother   . Diabetes Maternal Grandmother   . Cancer Maternal Grandfather     Social History Social History   Tobacco Use  . Smoking status: Never Smoker  . Smokeless tobacco: Never Used   Substance Use Topics  . Alcohol use: Never    Frequency: Never  . Drug use: Never    Review of Systems Constitutional: No fever/chills.  Denies or syncope.  No trauma. Eyes: No visual changes.  No blurred or double vision. ENT: No sore throat. No congestion or rhinorrhea. Cardiovascular: Denies chest pain. Denies palpitations. Respiratory: Denies shortness of breath.  No cough. Gastrointestinal: No abdominal pain.  No nausea, no vomiting.  No diarrhea.  No constipation. Genitourinary: Negative for dysuria. Musculoskeletal: Negative for back pain. Skin: Negative for rash. Neurological: Negative for headaches. No focal numbness, tingling.  Positive for slurred speech and expressive aphasia.  Positive for left foot drop.    ____________________________________________   PHYSICAL EXAM:  VITAL SIGNS: ED Triage Vitals [08/18/18 1800]  Enc Vitals Group     BP 125/72     Pulse Rate (!) 109     Resp 18     Temp 98.3 F (36.8 C)     Temp src      SpO2 99 %     Weight 162 lb (73.5 kg)     Height 5\' 6"  (1.676 m)     Head Circumference      Peak Flow      Pain Score 0     Pain Loc      Pain Edu?      Excl. in [place]?     Constitutional: The patient is alert and oriented x4.  She does have speech  abnormalities. Eyes: Conjunctivae are normal.  EOMI. PERRLA.  No scleral icterus. Head: Atraumatic. Nose: No congestion/rhinnorhea. Mouth/Throat: Mucous membranes are mildly dry.  Neck: No stridor.  Supple.  No meningismus.  No JVD. Cardiovascular: Normal rate, regular rhythm. No murmurs, rubs or gallops.  Respiratory: Normal respiratory effort.  No accessory muscle use or retractions. Lungs CTAB.  No wheezes, rales or ronchi. Gastrointestinal: Soft, nontender and nondistended.  No guarding or rebound.  No peritoneal signs. Musculoskeletal: No LE edema. No ttp in the calves or palpable cords.  Negative Homan's sign. Neurologic: Alert and oriented 3.  Patient does have some slurred  speech and evidence of expressive aphasia.  Her naming is intact but repetition is abnormal.  Face and smile symmetric. Tongue is midline.  EOMI and PERRLA.  No horizontal or vertical nystagmus.  No pronator drift. 5 out of 5 grip, biceps, triceps, hip flexors, plantar flexion and dorsiflexion.  Initial left hip flexion, dorsiflexion and plantar flexion are weak but when I isolate these and ask her for best effort she is able to give 5 out of 5 strength.  Normal sensation to light touch in the bilateral upper and lower extremities, and face. Normal finger-nose-finger. Skin:  Skin is warm, dry and intact. No rash noted. Psychiatric: Mood and affect are normal. Speech and behavior are normal.  Normal judgement.  ____________________________________________   LABS (all labs ordered are listed, but only abnormal results are displayed)  Labs Reviewed  COMPREHENSIVE METABOLIC PANEL - Abnormal; Notable for the following components:      Result Value   Potassium 3.4 (*)    Glucose, Bld 107 (*)    Alkaline Phosphatase 23 (*)    Total Bilirubin 1.4 (*)    All other components within normal limits  PROTIME-INR  APTT  CBC  DIFFERENTIAL  TROPONIN I  URINALYSIS, COMPLETE (UACMP) WITH MICROSCOPIC  URINE DRUG SCREEN, QUALITATIVE ([facility] ONLY)  ETHANOL  CBG MONITORING, ED  POC URINE PREG, ED   ____________________________________________  EKG  ED ECG REPORT I, Anne-Caroline Mariea Clonts, the attending physician, personally viewed and interpreted this ECG.   Date: 08/18/2018  EKG Time: 1816  Rate: 114  Rhythm: sinus tachycardia; RBBB  Axis: normal  Intervals:none  ST&T Change: No STEMI  ____________________________________________  RADIOLOGY  Ct Head Wo Contrast  Result Date: 08/18/2018 CLINICAL DATA:  Slurred speech since last night. EXAM: CT HEAD WITHOUT CONTRAST TECHNIQUE: Contiguous axial images were obtained from the base of the skull through the vertex without intravenous contrast.  COMPARISON:  Head CT report dated 09/20/2002. FINDINGS: Brain: Mild-to-moderate diffuse enlargement of the ventricles and subarachnoid spaces. Mild patchy white matter low density in both cerebral hemispheres. No intracranial hemorrhage, mass lesion or CT evidence of acute infarction. Vascular: No hyperdense vessel or unexpected calcification. Skull: Normal. Negative for fracture or focal lesion. Sinuses/Orbits: Unremarkable. Other: None. IMPRESSION: 1. No acute abnormality. 2. Mild to moderate diffuse cerebral and cerebellar atrophy, not normally seen at this age. 3. Mild chronic small vessel white matter ischemic changes in both cerebral hemispheres, also not normally seen at this age. Electronically Signed   By: Claudie Revering M.D.   On: 08/18/2018 19:08    ____________________________________________   PROCEDURES  Procedure(s) performed: None  Procedures  Critical Care performed: No ____________________________________________   INITIAL IMPRESSION / ASSESSMENT AND PLAN / ED COURSE  Pertinent labs & imaging results that were available during my care of the patient were reviewed by me and considered in my medical decision making (see chart  for details).  38 y.o. female with 2 days of slurred speech, expressive aphasia and left leg weakness.  Overall, I am concerned about the patient's neurologic abnormalities.  A CT scan shows atrophy and chronic ischemic microvascular disease that is abnormal for age.  An MRI has been ordered to evaluate for stroke, as well as multiple sclerosis which would be more likely at her age and with her risk factors.  Progressive degenerative disease with early onset is also considered.  I have initiated tele-neurology evaluation, and have admitted the patient to the hospitalist for continued evaluation and treatment.  ____________________________________________  FINAL CLINICAL IMPRESSION(S) / ED DIAGNOSES  Final diagnoses:  Expressive aphasia  Slurred speech   Left leg weakness  Left foot drop         NEW MEDICATIONS STARTED DURING THIS VISIT:  New Prescriptions   No medications on file      Eula Listen, MD 08/18/18 2319

## 2018-08-18 NOTE — ED Notes (Signed)
Ct holding until pt gives urine sample. Spoke with pt - she states "no way in heck im pregnant" and agreed to ct without urine sample. Per ct cancel pregnancy test so they can come and take her to ct.

## 2018-08-18 NOTE — ED Triage Notes (Signed)
Slurred speech that started last night and also left leg "not moving right" and is having difficulty ambulating

## 2018-08-19 ENCOUNTER — Observation Stay: Payer: Self-pay

## 2018-08-19 ENCOUNTER — Other Ambulatory Visit: Payer: Self-pay

## 2018-08-19 DIAGNOSIS — E876 Hypokalemia: Secondary | ICD-10-CM | POA: Diagnosis not present

## 2018-08-19 DIAGNOSIS — R4781 Slurred speech: Secondary | ICD-10-CM | POA: Diagnosis not present

## 2018-08-19 DIAGNOSIS — R531 Weakness: Secondary | ICD-10-CM | POA: Diagnosis not present

## 2018-08-19 DIAGNOSIS — G35 Multiple sclerosis: Principal | ICD-10-CM

## 2018-08-19 LAB — GRAM STAIN

## 2018-08-19 LAB — URINALYSIS, COMPLETE (UACMP) WITH MICROSCOPIC
BILIRUBIN URINE: NEGATIVE
Glucose, UA: NEGATIVE mg/dL
Ketones, ur: NEGATIVE mg/dL
LEUKOCYTES UA: NEGATIVE
NITRITE: NEGATIVE
Protein, ur: 30 mg/dL — AB
RBC / HPF: >50 RBC/hpf — ABNORMAL HIGH (ref 0–5)
Specific Gravity, Urine: 1.012 (ref 1.005–1.030)
pH: 6 (ref 5.0–8.0)

## 2018-08-19 LAB — URINE DRUG SCREEN, QUALITATIVE (ARMC ONLY)
Amphetamines, Ur Screen: NOT DETECTED
Barbiturates, Ur Screen: NOT DETECTED
Benzodiazepine, Ur Scrn: NOT DETECTED
COCAINE METABOLITE, UR ~~LOC~~: NOT DETECTED
Cannabinoid 50 Ng, Ur ~~LOC~~: NOT DETECTED
MDMA (Ecstasy)Ur Screen: NOT DETECTED
Methadone Scn, Ur: NOT DETECTED
Opiate, Ur Screen: NOT DETECTED
Phencyclidine (PCP) Ur S: NOT DETECTED
Tricyclic, Ur Screen: NOT DETECTED

## 2018-08-19 LAB — POC URINE PREG, ED: Preg Test, Ur: NEGATIVE

## 2018-08-19 LAB — CSF CELL COUNT WITH DIFFERENTIAL
Eosinophils, CSF: 0 %
Lymphs, CSF: 91 %
Monocyte-Macrophage-Spinal Fluid: 9 %
RBC Count, CSF: 734 /mm3 — ABNORMAL HIGH (ref 0–3)
Segmented Neutrophils-CSF: 0 %
Tube #: 3
WBC, CSF: 4 /mm3 (ref 0–5)

## 2018-08-19 LAB — PROTEIN, CSF: Total  Protein, CSF: 38 mg/dL (ref 15–45)

## 2018-08-19 LAB — PREGNANCY, URINE: Preg Test, Ur: NEGATIVE

## 2018-08-19 LAB — GLUCOSE, CSF: Glucose, CSF: 61 mg/dL (ref 40–70)

## 2018-08-19 LAB — TSH: TSH: 0.477 u[IU]/mL (ref 0.350–4.500)

## 2018-08-19 MED ORDER — PANTOPRAZOLE SODIUM 40 MG PO TBEC: 40.0000 mg | DELAYED_RELEASE_TABLET | Freq: Every day | ORAL | Status: DC

## 2018-08-19 MED ORDER — LORATADINE 10 MG PO TABS
10.0000 mg | ORAL_TABLET | Freq: Every day | ORAL | Status: DC
  Administered 2018-08-19 – 2018-08-23 (×5): 10 mg via ORAL
  Filled 2018-08-19 (×5): qty 1

## 2018-08-19 MED ORDER — ZOLPIDEM TARTRATE 5 MG PO TABS: 5.0000 mg | ORAL_TABLET | Freq: Every evening | ORAL | Status: DC | PRN

## 2018-08-19 MED ORDER — ONDANSETRON HCL 4 MG PO TABS
4.0000 mg | ORAL_TABLET | Freq: Four times a day (QID) | ORAL | Status: DC | PRN
  Administered 2018-08-21: 4 mg via ORAL
  Filled 2018-08-19: qty 1

## 2018-08-19 MED ORDER — ONDANSETRON HCL 4 MG/2ML IJ SOLN
4.0000 mg | Freq: Four times a day (QID) | INTRAMUSCULAR | Status: DC | PRN
  Administered 2018-08-19 – 2018-08-20 (×2): 4 mg via INTRAVENOUS
  Filled 2018-08-19 (×2): qty 2

## 2018-08-19 MED ORDER — SODIUM CHLORIDE 0.9 % IV SOLN
1000.0000 mg | Freq: Every day | INTRAVENOUS | Status: AC
  Administered 2018-08-19 – 2018-08-23 (×5): 1000 mg via INTRAVENOUS
  Filled 2018-08-19 (×2): qty 8
  Filled 2018-08-19: qty 4
  Filled 2018-08-19 (×3): qty 8

## 2018-08-19 MED ORDER — DOCUSATE SODIUM 100 MG PO CAPS
100.0000 mg | ORAL_CAPSULE | Freq: Two times a day (BID) | ORAL | Status: DC
  Filled 2018-08-19 (×2): qty 1

## 2018-08-19 MED ORDER — POTASSIUM CHLORIDE IN NACL 40-0.9 MEQ/L-% IV SOLN
INTRAVENOUS | Status: DC
  Administered 2018-08-19 – 2018-08-20 (×2): 125 mL/h via INTRAVENOUS
  Filled 2018-08-19 (×5): qty 1000

## 2018-08-19 MED ORDER — SODIUM CHLORIDE 0.9 % IV SOLN
INTRAVENOUS | Status: DC | PRN
  Administered 2018-08-19 – 2018-08-22 (×2): 500 mL via INTRAVENOUS

## 2018-08-19 MED ORDER — ACETAMINOPHEN 325 MG PO TABS
650.0000 mg | ORAL_TABLET | Freq: Four times a day (QID) | ORAL | Status: DC | PRN
  Administered 2018-08-19 – 2018-08-22 (×6): 650 mg via ORAL
  Filled 2018-08-19 (×6): qty 2

## 2018-08-19 MED ORDER — ENOXAPARIN SODIUM 40 MG/0.4ML ~~LOC~~ SOLN
40.0000 mg | SUBCUTANEOUS | Status: DC
  Filled 2018-08-19: qty 0.4

## 2018-08-19 MED ORDER — GADOBUTROL 1 MMOL/ML IV SOLN
7.0000 mL | Freq: Once | INTRAVENOUS | Status: AC | PRN
  Administered 2018-08-19: 7 mL via INTRAVENOUS

## 2018-08-19 MED ORDER — ACETAMINOPHEN 650 MG RE SUPP: 650.0000 mg | Freq: Four times a day (QID) | RECTAL | Status: DC | PRN

## 2018-08-19 MED ORDER — PANTOPRAZOLE SODIUM 40 MG PO TBEC
40.0000 mg | DELAYED_RELEASE_TABLET | Freq: Every day | ORAL | Status: DC
  Administered 2018-08-19 – 2018-08-23 (×5): 40 mg via ORAL
  Filled 2018-08-19 (×5): qty 1

## 2018-08-19 NOTE — Progress Notes (Signed)
Neuro deficits improving.  MRI brain shows demyelination.  No CVA.  Discussed with neurology Dr. Paschal Dopp.  Will evaluate and decide on treatment.

## 2018-08-19 NOTE — ED Notes (Signed)
Pt reports acute onset of nausea. Orders for Zofran released at this time.

## 2018-08-19 NOTE — ED Notes (Signed)
2 attempts to call report to floor. Due to staffing unable to take pt at this time. Floor will call when ready

## 2018-08-19 NOTE — Progress Notes (Signed)
Pt stable after lp.Back stable posterior procedure instructions given to patient.F/U with her M.D.

## 2018-08-19 NOTE — ED Notes (Signed)
Staff from floor arrived to take pt

## 2018-08-19 NOTE — ED Notes (Signed)
Report gotten from [facility] - she advised that she attempted to call report and was advised that there was no staff to accept pt and that they would return call when bed could be taken - per Solmon Ice RN she attempted to call report x2

## 2018-08-19 NOTE — Progress Notes (Signed)
   08/19/18 0900  Clinical Encounter Type  Visited With Patient  Visit Type Initial  Referral From Other (Comment) (Unit secretary; patient in ED since last evening at 6:30 pm)  Stress Factors  Patient Stress Factors Health changes;Lack of knowledge    Chaplain visited patient after referral from unit secretary (long wait in ED and follow-up to call her mother). Patient alert, sitting up in bed. Pleasant and welcoming upon chaplain's arrival. Patient told story of what brought her to the ED and notes significant care and concern shown by manager, coworkers, son, mother and entire family. She's grateful that a call was made to her manager who detected that something was amiss in her speech. Patient indicates that she hopes to go home soon and that her speech has improved since arrival.

## 2018-08-19 NOTE — Progress Notes (Signed)
SLP Cancellation Note  Patient Details Name: Desiree Chavez MRN: [id number] DOB: 24-Apr-1981   Cancelled treatment:       Reason Eval/Treat Not Completed: Patient at procedure or test/unavailable(chart reviewed; pt off floor at this time). Pt at procedure. Will f/u tomorrow w/ assessment. Per MRI, demyelination disease noted; No CVA. Pt has a positive family history of multiple sclerosis per Neurology note. Recommend aspiration precautions w/ any oral intake.   Orinda Kenner, [place], CCC-SLP Watson,Katherine 08/19/2018, 2:14 PM

## 2018-08-19 NOTE — H&P (Signed)
Desiree Chavez is an 38 y.o. female.   Chief Complaint: Speech difficulty HPI: The patient with past medical history of endometriosis presents to the emergency department complaining of difficulty speaking.  The patient called her boss this morning to report that she may be late due to inclement weather when her supervisor inquired as to whether the patient may be drunk.  The patient denies drinking alcohol or use of narcotic medication but admits that she is aware that her word finding ability and fluencey of speech is impaired.  The patient denies numbness in any of her extremities but admits to vague weakness of her left lower extremity.  CT of the brain was obtained which showed atrophic changes as well as white matter ischemic changes not appropriate for age which prompted the emergency department staff to call the hospitalist service for admission.  Past Medical History:  Diagnosis Date  . Endometriosis     Past Surgical History:  Procedure Laterality Date  . LAPAROSCOPIC ENDOMETRIOSIS FULGURATION N/A     Family History  Problem Relation Age of Onset  . Diabetes Mother   . Hypertension Mother   . Hyperlipidemia Mother   . Depression Mother   . Hypertension Father   . Hyperlipidemia Father   . Depression Father   . Stroke Father   . Bipolar disorder Father   . Bartter's syndrome Sister   . Heart disease Maternal Grandmother   . COPD Maternal Grandmother   . Diabetes Maternal Grandmother   . Cancer Maternal Grandfather    Social History:  reports that she has never smoked. She has never used smokeless tobacco. She reports that she does not drink alcohol or use drugs.  Allergies:  Allergies  Allergen Reactions  . Codeine Rash    Prior to Admission medications   Medication Sig Start Date End Date Taking? Authorizing Provider  fexofenadine (ALLEGRA) 180 MG tablet Take 180 mg by mouth daily.   Yes [provider]  norgestimate-ethinyl estradiol (ORTHO-CYCLEN,  28,) 0.25-35 MG-MCG tablet Take 1 tablet by mouth daily. 02/18/18  Yes Volney American, PA-C     Results for orders placed or performed during the hospital encounter of 08/18/18 (from the past 48 hour(s))  Protime-INR     Status: None   Collection Time: 08/18/18  6:11 PM  Result Value Ref Range   Prothrombin Time 13.0 11.4 - 15.2 seconds   INR 0.99     Comment: Performed at [facility], [address] [place], [place] [address]  APTT     Status: None   Collection Time: 08/18/18  6:11 PM  Result Value Ref Range   aPTT 28 24 - 36 seconds    Comment: Performed at [facility], [address] [place], [place] [address]  CBC     Status: None   Collection Time: 08/18/18  6:11 PM  Result Value Ref Range   WBC 6.3 4.0 - 10.5 K/uL   RBC 4.30 3.87 - 5.11 MIL/uL   Hemoglobin 12.5 12.0 - 15.0 g/dL   HCT 37.7 36.0 - 46.0 %   MCV 87.7 80.0 - 100.0 fL   MCH 29.1 26.0 - 34.0 pg   MCHC 33.2 30.0 - 36.0 g/dL   RDW 12.6 11.5 - 15.5 %   Platelets 208 150 - 400 K/uL   nRBC 0.0 0.0 - 0.2 %    Comment: Performed at [facility], [address], [place], [place] [address]  Differential     Status:  None   Collection Time: 08/18/18  6:11 PM  Result Value Ref Range   Neutrophils Relative % 65 %   Neutro Abs 4.2 1.7 - 7.7 K/uL   Lymphocytes Relative 26 %   Lymphs Abs 1.6 0.7 - 4.0 K/uL   Monocytes Relative 6 %   Monocytes Absolute 0.4 0.1 - 1.0 K/uL   Eosinophils Relative 2 %   Eosinophils Absolute 0.1 0.0 - 0.5 K/uL   Basophils Relative 1 %   Basophils Absolute 0.0 0.0 - 0.1 K/uL   Immature Granulocytes 0 %   Abs Immature Granulocytes 0.01 0.00 - 0.07 K/uL    Comment: Performed at [facility], [address] [place], [place] [address]  Comprehensive metabolic panel     Status: Abnormal   Collection Time: 08/18/18  6:11 PM  Result Value Ref Range   Sodium 137 135 - 145 mmol/L   Potassium 3.4 (L) 3.5 - 5.1 mmol/L   Chloride 104 98 - 111 mmol/L    CO2 24 22 - 32 mmol/L   Glucose, Bld 107 (H) 70 - 99 mg/dL   BUN 10 6 - 20 mg/dL   Creatinine, Ser 0.62 0.44 - 1.00 mg/dL   Calcium 9.0 8.9 - 10.3 mg/dL   Total Protein 7.8 6.5 - 8.1 g/dL   Albumin 4.4 3.5 - 5.0 g/dL   AST 23 15 - 41 U/L   ALT 18 0 - 44 U/L   Alkaline Phosphatase 23 (L) 38 - 126 U/L   Total Bilirubin 1.4 (H) 0.3 - 1.2 mg/dL   GFR calc non Af Amer >60 >60 mL/min   GFR calc Af Amer >60 >60 mL/min   Anion gap 9 5 - 15    Comment: Performed at [facility], [address] [place], [place] [address]  Troponin I - Once     Status: None   Collection Time: 08/18/18  6:11 PM  Result Value Ref Range   Troponin I <0.03 <0.03 ng/mL    Comment: Performed at [facility], [address] [place], [place] [address]  Urinalysis, Complete w Microscopic     Status: Abnormal   Collection Time: 08/18/18 10:09 PM  Result Value Ref Range   Color, Urine YELLOW (A) YELLOW   APPearance CLEAR (A) CLEAR   Specific Gravity, Urine 1.012 1.005 - 1.030   pH 6.0 5.0 - 8.0   Glucose, UA NEGATIVE NEGATIVE mg/dL   Hgb urine dipstick LARGE (A) NEGATIVE   Bilirubin Urine NEGATIVE NEGATIVE   Ketones, ur NEGATIVE NEGATIVE mg/dL   Protein, ur 30 (A) NEGATIVE mg/dL   Nitrite NEGATIVE NEGATIVE   Leukocytes, UA NEGATIVE NEGATIVE   RBC / HPF >50 (H) 0 - 5 RBC/hpf   WBC, UA 21-50 0 - 5 WBC/hpf   Bacteria, UA RARE (A) NONE SEEN   Squamous Epithelial / LPF 0-5 0 - 5   Mucus PRESENT     Comment: Performed at [facility], [address] [place],  [address]  Urine Drug Screen, Qualitative ([facility] only)     Status: None   Collection Time: 08/18/18 10:09 PM  Result Value Ref Range   Tricyclic, Ur Screen NONE DETECTED NONE DETECTED   Amphetamines, Ur Screen NONE DETECTED NONE DETECTED   MDMA (Ecstasy)Ur Screen NONE DETECTED NONE DETECTED   Cocaine Metabolite,Ur [place] NONE DETECTED NONE DETECTED   Opiate, Ur Screen NONE DETECTED NONE DETECTED   Phencyclidine (PCP) Ur  S NONE DETECTED NONE DETECTED   Cannabinoid 50  Ng, Ur [place] NONE DETECTED NONE DETECTED   Barbiturates, Ur Screen NONE DETECTED NONE DETECTED   Benzodiazepine, Ur Scrn NONE DETECTED NONE DETECTED   Methadone Scn, Ur NONE DETECTED NONE DETECTED    Comment: (NOTE) Tricyclics + metabolites, urine    Cutoff 1000 ng/mL Amphetamines + metabolites, urine  Cutoff 1000 ng/mL MDMA (Ecstasy), urine              Cutoff 500 ng/mL Cocaine Metabolite, urine          Cutoff 300 ng/mL Opiate + metabolites, urine        Cutoff 300 ng/mL Phencyclidine (PCP), urine         Cutoff 25 ng/mL Cannabinoid, urine                 Cutoff 50 ng/mL Barbiturates + metabolites, urine  Cutoff 200 ng/mL Benzodiazepine, urine              Cutoff 200 ng/mL Methadone, urine                   Cutoff 300 ng/mL The urine drug screen provides only a preliminary, unconfirmed analytical test result and should not be used for non-medical purposes. Clinical consideration and professional judgment should be applied to any positive drug screen result due to possible interfering substances. A more specific alternate chemical method must be used in order to obtain a confirmed analytical result. Gas chromatography / mass spectrometry (GC/MS) is the preferred confirmat ory method. Performed at [facility], [address] [place], [place] [address]   Ethanol     Status: None   Collection Time: 08/18/18 10:09 PM  Result Value Ref Range   Alcohol, Ethyl (B) <10 <10 mg/dL    Comment: (NOTE) Lowest detectable limit for serum alcohol is 10 mg/dL. For medical purposes only. Performed at [facility], [address] [place], [place] [address]   POC Urine Pregnancy, ED     Status: None   Collection Time: 08/18/18 10:09 PM  Result Value Ref Range   Preg Test, Ur Negative Negative    Comment: Pt is on her period   Ct Head Wo Contrast  Result Date: 08/18/2018 CLINICAL DATA:  Slurred speech since last night. EXAM:  CT HEAD WITHOUT CONTRAST TECHNIQUE: Contiguous axial images were obtained from the base of the skull through the vertex without intravenous contrast. COMPARISON:  Head CT report dated 09/20/2002. FINDINGS: Brain: Mild-to-moderate diffuse enlargement of the ventricles and subarachnoid spaces. Mild patchy white matter low density in both cerebral hemispheres. No intracranial hemorrhage, mass lesion or CT evidence of acute infarction. Vascular: No hyperdense vessel or unexpected calcification. Skull: Normal. Negative for fracture or focal lesion. Sinuses/Orbits: Unremarkable. Other: None. IMPRESSION: 1. No acute abnormality. 2. Mild to moderate diffuse cerebral and cerebellar atrophy, not normally seen at this age. 3. Mild chronic small vessel white matter ischemic changes in both cerebral hemispheres, also not normally seen at this age. Electronically Signed   By: Claudie Revering M.D.   On: 08/18/2018 19:08   Mr Jeri Cos And Wo Contrast  Result Date: 08/19/2018 CLINICAL DATA:  Initial evaluation for acute ataxia, sudden onset speech difficulty, left leg weakness. EXAM: MRI HEAD WITHOUT AND WITH CONTRAST TECHNIQUE: Multiplanar, multiecho pulse sequences of the brain and surrounding structures were obtained without and with intravenous contrast. CONTRAST:  7 cc of Gadavist. COMPARISON:  Prior CT from 08/18/2018 FINDINGS: Brain: Generalized cerebral and cerebellar atrophy, advanced for patient age. Extensive patchy and  confluent T2/FLAIR hyperintensity seen involving the periventricular, deep, and subcortical white matter both cerebral hemispheres. Several of these foci are oriented perpendicular to the lateral ventricles. Involvement most pronounced at the right frontoparietal region and adjacent to the frontal horns of both lateral ventricles. Patchy signal abnormality also seen within the brainstem and cerebellum, most notably at the lateral left pons (series 9, image 9). Scattered diffusion abnormality seen about  several of these lesions. With contrast administration, innumerable enhancing lesions seen scattered throughout the cerebral white matter, corresponding with several of these white matter lesions. Several of these lesions demonstrate full ring and incomplete rings of enhancement. Few small enhancing lesions noted at the left pons and cerebellum as well. Constellation of findings felt to be most consistent with demyelinating disease/multiple sclerosis. Note also made of a small white matter lesion within the right hemi cord at the upper cervical spine (series 7, image 2). No evidence for acute vascular infarct. Gray-white matter differentiation maintained without evidence for remote cortical infarction. No foci of susceptibility artifact to suggest acute or chronic intracranial hemorrhage. No mass lesion, midline shift or mass effect. No hydrocephalus. No extra-axial fluid collection. Pituitary gland within normal limits. Midline structures intact. Corpus callosum somewhat thinned and atrophic. Vascular: Major intracranial vascular flow voids maintained. Skull and upper cervical spine: Craniocervical junction within normal limits. Bone marrow signal intensity grossly normal. No focal marrow replacing lesion. No scalp soft tissue abnormality. Sinuses/Orbits: Globes and orbital soft tissues demonstrate no acute finding. Multiple complex retention cyst noted within the right greater than left maxillary sinuses. Paranasal sinuses are otherwise clear. Possible high-riding jugular bulb noted on the right (series 7, image 6). No mastoid effusion. Other: None. IMPRESSION: 1. Extensive abnormal cerebral white matter changes involving the supratentorial and infratentorial cerebral white matter as above, most suspicious for demyelinating disease. Multiple enhancing lesions throughout the brain following contrast administration compatible with active demyelination. 2. Underlying age advanced cerebral and cerebellar atrophy. 3.  Focal cord signal abnormality within the right hemi cord of the upper cervical spine, also likely related to demyelinating disease. Further evaluation with imaging of the remaining neural axis suggested for complete evaluation. Electronically Signed   By: Jeannine Boga M.D.   On: 08/19/2018 01:29    Review of Systems  Constitutional: Negative for chills and fever.  HENT: Negative for sore throat and tinnitus.   Eyes: Negative for blurred vision and redness.  Respiratory: Negative for cough and shortness of breath.   Cardiovascular: Negative for chest pain, palpitations, orthopnea and PND.  Gastrointestinal: Negative for abdominal pain, diarrhea, nausea and vomiting.  Genitourinary: Negative for dysuria, frequency and urgency.  Musculoskeletal: Negative for joint pain and myalgias.  Skin: Negative for rash.       No lesions  Neurological: Positive for speech change and focal weakness. Negative for weakness.  Endo/Heme/Allergies: Does not bruise/bleed easily.       No temperature intolerance  Psychiatric/Behavioral: Negative for depression and suicidal ideas.    Blood pressure 104/68, pulse 85, temperature 98.3 F (36.8 C), resp. rate 16, height 5\' 6"  (1.676 m), weight 73.5 kg, last menstrual period 08/17/2018, SpO2 98 %. Physical Exam  Vitals reviewed. Constitutional: She is oriented to person, place, and time. She appears well-developed and well-nourished. No distress.  HENT:  Head: Normocephalic and atraumatic.  Mouth/Throat: Oropharynx is clear and moist.  Eyes: Pupils are equal, round, and reactive to light. Conjunctivae and EOM are normal.  Neck: Normal range of motion. Neck supple. No JVD present. No  tracheal deviation present. No thyromegaly present.  Cardiovascular: Normal rate, regular rhythm and normal heart sounds. Exam reveals no gallop and no friction rub.  No murmur heard. Respiratory: Effort normal and breath sounds normal.  GI: Soft. Bowel sounds are normal. She  exhibits no distension. There is no abdominal tenderness.  Genitourinary:    Genitourinary Comments: Deferred   Musculoskeletal: Normal range of motion.        General: No edema.  Lymphadenopathy:    She has no cervical adenopathy.  Neurological: She is alert and oriented to person, place, and time. No cranial nerve deficit. She exhibits normal muscle tone.  speech is still slurred  Skin: Skin is warm and dry. No rash noted. No erythema.  Psychiatric: She has a normal mood and affect. Her behavior is normal. Judgment and thought content normal.     Assessment/Plan This is a 38 year old female admitted for slurred speech. 1.  Slurred speech: Likely secondary to cerebral atrophy.  CT and MRI concerning for demyelinating disease.  Consult neurology for further guidance. 2.  Weakness: None on physical exam but the patient still reports that her left leg is not quite normal; the patient reports feeling that difficulty with initiation or coordination of left lower extremity movement.  Symptoms could be explained by global white matter change to some of basal ganglia. 3.  Hypokalemia: May also contribute to weakness.  Replete potassium. 4.  DVT prophylaxis: Lovenox 5.  GI prophylaxis: None The patient is a full code.  Time spent on admission orders and patient care approximately 45 minutes  Harrie Foreman, MD 08/19/2018, 7:04 AM

## 2018-08-19 NOTE — Progress Notes (Signed)
Patient to xray for lumbar puncture.

## 2018-08-19 NOTE — ED Notes (Addendum)
Pt off the unit in MRI

## 2018-08-19 NOTE — Consult Note (Addendum)
NEUROLOGY CONSULT  Reason for Consult: slurred speech  Referring Physician: Dr. Darvin Neighbours  CC: Slurred speech   HPI: Desiree Chavez is an 38 y.o. female with significant past medical history C/W endometriosis presented to the hospital complaining about 3 days of slurred speech. Patient's symptoms started on Wednesday night, patient stated that her speech was slurred and she could not get her words out, she thought that it was due to being tired, when she reported just to work on Thursday morning her manager asked her if she was drunk due to 'slurring her words', patient symptom has not improved so she decided to come the emergency room to seek medical attention. Patient also reported that her left lower extremity was weak, she could not lift it up when she tried, patient denied any visual changes, she denied any fall, she denies any numbness. Patient reported that couple months ago her left lower extremity was weak for couple of days and it resolved on its own.  Patient reported no incontinence of the stool or urine. In the emergency room patient had an MRI of the brain with and without contrast which shows multiple lesions that is consistent with active demyelinating disease she was admitted to the hospital for further work-up. Pt has a positive family history of multiple sclerosis ( Aunt from mother side) .  Past Medical History Past Medical History:  Diagnosis Date  . Endometriosis     Past Surgical History Past Surgical History:  Procedure Laterality Date  . LAPAROSCOPIC ENDOMETRIOSIS FULGURATION N/A     Family History Family History  Problem Relation Age of Onset  . Diabetes Mother   . Hypertension Mother   . Hyperlipidemia Mother   . Depression Mother   . Hypertension Father   . Hyperlipidemia Father   . Depression Father   . Stroke Father   . Bipolar disorder Father   . Bartter's syndrome Sister   . Heart disease Maternal Grandmother   . COPD Maternal Grandmother   .  Diabetes Maternal Grandmother   . Cancer Maternal Grandfather     Social History    reports that she has never smoked. She has never used smokeless tobacco. She reports that she does not drink alcohol or use drugs.  Allergies Allergies  Allergen Reactions  . Codeine Rash    Home Medications (Not in a hospital admission)   Hospital Medications . sodium chloride flush  3 mL Intravenous Once     ROS:  Negative except of what is reported in HPI    Physical Examination: Vitals:   08/18/18 1800 08/18/18 2225 08/19/18 0259 08/19/18 0512  BP: 125/72 119/78 114/64 104/68  Pulse: (!) 109 95 93 85  Resp: 18 16 16 16   Temp: 98.3 F (36.8 C)     SpO2: 99% 98% 98% 98%  Weight: 73.5 kg     Height: 5\' 6"  (1.676 m)       General - emotional, crying at times  Heart - Regular rate and rhythm - no murmer Lungs - Clear to auscultation Abdomen - Soft - non tender Extremities - Distal pulses intact - no edema Skin - Warm and dry  Neurologic Examination:   Mental Status:  Alert, oriented, thought content appropriate.  Speech slurred, no aphasia, normal naming  Able to follow 3 step commands without difficulty.  Cranial Nerves:  bilateral visual fields intact, disk normal no papilledema  Pupils were equal and reacted. Extraocular movements were full.  no facial numbness and no facial weakness.  hearing normal.  midline tongue extension  Motor: RUE 4/5 LUE 5/5 RLE 5/5 LLE 4/5  Tone and bulk:normal tone throughout; no atrophy noted  Sensory: Intact to light touch in all extremities. Deep Tendon Reflexes: HYPERREFLEXIC throughout  Cerebellar: dysmetria  Gait: not tested   LABORATORY STUDIES:  Basic Metabolic Panel: Recent Labs  Lab 08/18/18 1811  NA 137  K 3.4*  CL 104  CO2 24  GLUCOSE 107*  BUN 10  CREATININE 0.62  CALCIUM 9.0    Liver Function Tests: Recent Labs  Lab 08/18/18 1811  AST 23  ALT 18  ALKPHOS 23*  BILITOT 1.4*  PROT 7.8  ALBUMIN 4.4    No results for input(s): LIPASE, AMYLASE in the last 168 hours. No results for input(s): AMMONIA in the last 168 hours.  CBC: Recent Labs  Lab 08/18/18 1811  WBC 6.3  NEUTROABS 4.2  HGB 12.5  HCT 37.7  MCV 87.7  PLT 208    Cardiac Enzymes: Recent Labs  Lab 08/18/18 1811  TROPONINI <0.03    BNP: Invalid input(s): POCBNP  CBG: No results for input(s): GLUCAP in the last 168 hours.  Microbiology:   Coagulation Studies: Recent Labs    08/18/18 1811  LABPROT 13.0  INR 0.99    Urinalysis:  Recent Labs  Lab 08/18/18 2209  COLORURINE YELLOW*  LABSPEC 1.012  PHURINE 6.0  GLUCOSEU NEGATIVE  HGBUR LARGE*  BILIRUBINUR NEGATIVE  KETONESUR NEGATIVE  PROTEINUR 30*  NITRITE NEGATIVE  LEUKOCYTESUR NEGATIVE    Lipid Panel:     Component Value Date/Time   CHOL 162 03/23/2018 0957   TRIG 85 03/23/2018 0957   HDL 55 03/23/2018 0957   LDLCALC 90 03/23/2018 0957    HgbA1C:  No results found for: HGBA1C  Urine Drug Screen:      Component Value Date/Time   LABOPIA NONE DETECTED 08/18/2018 2209   COCAINSCRNUR NONE DETECTED 08/18/2018 2209   LABBENZ NONE DETECTED 08/18/2018 2209   AMPHETMU NONE DETECTED 08/18/2018 2209   THCU NONE DETECTED 08/18/2018 2209   LABBARB NONE DETECTED 08/18/2018 2209     Alcohol Level:  Recent Labs  Lab 08/18/18 2209  ETH <10    Miscellaneous labs:  EKG  EKG  IMAGING: Ct Head Wo Contrast  Result Date: 08/18/2018 CLINICAL DATA:  Slurred speech since last night. EXAM: CT HEAD WITHOUT CONTRAST TECHNIQUE: Contiguous axial images were obtained from the base of the skull through the vertex without intravenous contrast. COMPARISON:  Head CT report dated 09/20/2002. FINDINGS: Brain: Mild-to-moderate diffuse enlargement of the ventricles and subarachnoid spaces. Mild patchy white matter low density in both cerebral hemispheres. No intracranial hemorrhage, mass lesion or CT evidence of acute infarction. Vascular: No hyperdense  vessel or unexpected calcification. Skull: Normal. Negative for fracture or focal lesion. Sinuses/Orbits: Unremarkable. Other: None. IMPRESSION: 1. No acute abnormality. 2. Mild to moderate diffuse cerebral and cerebellar atrophy, not normally seen at this age. 3. Mild chronic small vessel white matter ischemic changes in both cerebral hemispheres, also not normally seen at this age. Electronically Signed   By: Claudie Revering M.D.   On: 08/18/2018 19:08   Mr Jeri Cos And Wo Contrast  Result Date: 08/19/2018 CLINICAL DATA:  Initial evaluation for acute ataxia, sudden onset speech difficulty, left leg weakness. EXAM: MRI HEAD WITHOUT AND WITH CONTRAST TECHNIQUE: Multiplanar, multiecho pulse sequences of the brain and surrounding structures were obtained without and with intravenous contrast. CONTRAST:  7 cc of Gadavist. COMPARISON:  Prior CT from 08/18/2018  FINDINGS: Brain: Generalized cerebral and cerebellar atrophy, advanced for patient age. Extensive patchy and confluent T2/FLAIR hyperintensity seen involving the periventricular, deep, and subcortical white matter both cerebral hemispheres. Several of these foci are oriented perpendicular to the lateral ventricles. Involvement most pronounced at the right frontoparietal region and adjacent to the frontal horns of both lateral ventricles. Patchy signal abnormality also seen within the brainstem and cerebellum, most notably at the lateral left pons (series 9, image 9). Scattered diffusion abnormality seen about several of these lesions. With contrast administration, innumerable enhancing lesions seen scattered throughout the cerebral white matter, corresponding with several of these white matter lesions. Several of these lesions demonstrate full ring and incomplete rings of enhancement. Few small enhancing lesions noted at the left pons and cerebellum as well. Constellation of findings felt to be most consistent with demyelinating disease/multiple sclerosis. Note  also made of a small white matter lesion within the right hemi cord at the upper cervical spine (series 7, image 2). No evidence for acute vascular infarct. Gray-white matter differentiation maintained without evidence for remote cortical infarction. No foci of susceptibility artifact to suggest acute or chronic intracranial hemorrhage. No mass lesion, midline shift or mass effect. No hydrocephalus. No extra-axial fluid collection. Pituitary gland within normal limits. Midline structures intact. Corpus callosum somewhat thinned and atrophic. Vascular: Major intracranial vascular flow voids maintained. Skull and upper cervical spine: Craniocervical junction within normal limits. Bone marrow signal intensity grossly normal. No focal marrow replacing lesion. No scalp soft tissue abnormality. Sinuses/Orbits: Globes and orbital soft tissues demonstrate no acute finding. Multiple complex retention cyst noted within the right greater than left maxillary sinuses. Paranasal sinuses are otherwise clear. Possible high-riding jugular bulb noted on the right (series 7, image 6). No mastoid effusion. Other: None. IMPRESSION: 1. Extensive abnormal cerebral white matter changes involving the supratentorial and infratentorial cerebral white matter as above, most suspicious for demyelinating disease. Multiple enhancing lesions throughout the brain following contrast administration compatible with active demyelination. 2. Underlying age advanced cerebral and cerebellar atrophy. 3. Focal cord signal abnormality within the right hemi cord of the upper cervical spine, also likely related to demyelinating disease. Further evaluation with imaging of the remaining neural axis suggested for complete evaluation. Electronically Signed   By: Jeannine Boga M.D.   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  -Patient was informed about the MRI findings, she was informed about the potential of having demyelinating disease specifically multiple sclerosis, patient was very emotional she was crying, she was offered emotional support, she understands that disease since her aunt from her mother's side has the same disease and she watched her losing her mobility.  Patient was asked if she is sad or depressed and she denied that. -MRI of the spine axis including MRI of the C-spine T-spine and L-spine with contrast was ordered -Patient needs a lumbar puncture to be done under fluoroscopy to check for protein, oligoclonal clonal bands ( oligoclonal IgG bands in CSF/Serum), JC virus. -Pregnancy test was ordered -Patient was started on high-dose steroids for 5 days I will start methylprednisone thousand milligrams IV for 5 days, will start PPI for GI prophylaxis  -  In terms of long-term treatments, we need to make the diagnosis first by ordering the lumbar puncture, patient will follow-up as an outpatient with multiple sclerosis specialist and neurology specialist to start anti-multiple sclerosis drugs -Patient is concerning for dysphasia given the fact that she has slurred speech and difficulty swallowing, official speech evaluation was placed, please do not feed the patient until she is cleared by speech. -If the patient becomes depressed or sad we can start antidepression medication. -PT OT consult was placed.    Arnaldo Natal, MD

## 2018-08-19 NOTE — ED Notes (Signed)
ED TO INPATIENT HANDOFF REPORT  Name/Age/Gender Desiree Chavez 38 y.o. female  Code Status   Home/SNF/Other Home  Chief Complaint Slurred speech/unbalanced  Level of Care/Admitting Diagnosis ED Disposition    ED Disposition Condition [name] [[id number]]  Level of Care: Med-Surg [16]  Diagnosis: Slurred speech [phone]  Admitting Physician: MOOREA, BOISSONNEAULT [[id number]]  Attending Physician: RONIQUE, SIMERLY [[id number]]  PT Class (Do Not Modify): Observation [104]  PT Acc Code (Do Not Modify): Observation [10022]       Medical History Past Medical History:  Diagnosis Date  . Endometriosis     Allergies Allergies  Allergen Reactions  . Codeine Rash    IV Location/Drains/Wounds Patient Lines/Drains/Airways Status   Active Line/Drains/Airways    Name:   Placement date:   Placement time:   Site:   Days:   Peripheral IV 08/19/18 Right Wrist   08/19/18    0259    Wrist   less than 1          Labs/Imaging Results for orders placed or performed during the hospital encounter of 08/18/18 (from the past 48 hour(s))  Protime-INR     Status: None   Collection Time: 08/18/18  6:11 PM  Result Value Ref Range   Prothrombin Time 13.0 11.4 - 15.2 seconds   INR 0.99     Comment: Performed at [facility], [address] [place], [place] [address]  APTT     Status: None   Collection Time: 08/18/18  6:11 PM  Result Value Ref Range   aPTT 28 24 - 36 seconds    Comment: Performed at [facility], [address] [place], [place] [address]  CBC     Status: None   Collection Time: 08/18/18  6:11 PM  Result Value Ref Range   WBC 6.3 4.0 - 10.5 K/uL   RBC 4.30 3.87 - 5.11 MIL/uL   Hemoglobin 12.5 12.0 - 15.0 g/dL   HCT 37.7 36.0 - 46.0 %   MCV 87.7 80.0 - 100.0 fL   MCH 29.1 26.0 - 34.0 pg   MCHC 33.2 30.0 - 36.0 g/dL   RDW 12.6 11.5 - 15.5 %   Platelets 208 150 - 400 K/uL   nRBC 0.0 0.0 -  0.2 %    Comment: Performed at [facility], [address] [place], [place] [address]  Differential     Status: None   Collection Time: 08/18/18  6:11 PM  Result Value Ref Range   Neutrophils Relative % 65 %   Neutro Abs 4.2 1.7 - 7.7 K/uL   Lymphocytes Relative 26 %   Lymphs Abs 1.6 0.7 - 4.0 K/uL   Monocytes Relative 6 %   Monocytes Absolute 0.4 0.1 - 1.0 K/uL   Eosinophils Relative 2 %   Eosinophils Absolute 0.1 0.0 - 0.5 K/uL   Basophils Relative 1 %   Basophils Absolute 0.0 0.0 - 0.1 K/uL   Immature Granulocytes 0 %   Abs Immature Granulocytes 0.01 0.00 - 0.07 K/uL    Comment: Performed at [facility], [address] [place], [place] [address]  Comprehensive metabolic panel     Status: Abnormal   Collection Time: 08/18/18  6:11 PM  Result Value Ref Range   Sodium 137 135 - 145 mmol/L   Potassium 3.4 (L) 3.5 - 5.1 mmol/L   Chloride 104 98 - 111 mmol/L   CO2 24 22 - 32  mmol/L   Glucose, Bld 107 (H) 70 - 99 mg/dL   BUN 10 6 - 20 mg/dL   Creatinine, Ser 0.62 0.44 - 1.00 mg/dL   Calcium 9.0 8.9 - 10.3 mg/dL   Total Protein 7.8 6.5 - 8.1 g/dL   Albumin 4.4 3.5 - 5.0 g/dL   AST 23 15 - 41 U/L   ALT 18 0 - 44 U/L   Alkaline Phosphatase 23 (L) 38 - 126 U/L   Total Bilirubin 1.4 (H) 0.3 - 1.2 mg/dL   GFR calc non Af Amer >60 >60 mL/min   GFR calc Af Amer >60 >60 mL/min   Anion gap 9 5 - 15    Comment: Performed at [facility], [address] [place], [place] [address]  Troponin I - Once     Status: None   Collection Time: 08/18/18  6:11 PM  Result Value Ref Range   Troponin I <0.03 <0.03 ng/mL    Comment: Performed at [facility], [address] [place], [place] [address]  Urinalysis, Complete w Microscopic     Status: Abnormal   Collection Time: 08/18/18 10:09 PM  Result Value Ref Range   Color, Urine YELLOW (A) YELLOW   APPearance CLEAR (A) CLEAR   Specific Gravity, Urine 1.012 1.005 - 1.030   pH 6.0 5.0 - 8.0   Glucose,  UA NEGATIVE NEGATIVE mg/dL   Hgb urine dipstick LARGE (A) NEGATIVE   Bilirubin Urine NEGATIVE NEGATIVE   Ketones, ur NEGATIVE NEGATIVE mg/dL   Protein, ur 30 (A) NEGATIVE mg/dL   Nitrite NEGATIVE NEGATIVE   Leukocytes, UA NEGATIVE NEGATIVE   RBC / HPF >50 (H) 0 - 5 RBC/hpf   WBC, UA 21-50 0 - 5 WBC/hpf   Bacteria, UA RARE (A) NONE SEEN   Squamous Epithelial / LPF 0-5 0 - 5   Mucus PRESENT     Comment: Performed at [facility], [address] [place], [place] [address]  Urine Drug Screen, Qualitative ([facility] only)     Status: None   Collection Time: 08/18/18 10:09 PM  Result Value Ref Range   Tricyclic, Ur Screen NONE DETECTED NONE DETECTED   Amphetamines, Ur Screen NONE DETECTED NONE DETECTED   MDMA (Ecstasy)Ur Screen NONE DETECTED NONE DETECTED   Cocaine Metabolite,Ur [place] NONE DETECTED NONE DETECTED   Opiate, Ur Screen NONE DETECTED NONE DETECTED   Phencyclidine (PCP) Ur S NONE DETECTED NONE DETECTED   Cannabinoid 50 Ng, Ur [place] NONE DETECTED NONE DETECTED   Barbiturates, Ur Screen NONE DETECTED NONE DETECTED   Benzodiazepine, Ur Scrn NONE DETECTED NONE DETECTED   Methadone Scn, Ur NONE DETECTED NONE DETECTED    Comment: (NOTE) Tricyclics + metabolites, urine    Cutoff 1000 ng/mL Amphetamines + metabolites, urine  Cutoff 1000 ng/mL MDMA (Ecstasy), urine              Cutoff 500 ng/mL Cocaine Metabolite, urine          Cutoff 300 ng/mL Opiate + metabolites, urine        Cutoff 300 ng/mL Phencyclidine (PCP), urine         Cutoff 25 ng/mL Cannabinoid, urine                 Cutoff 50 ng/mL Barbiturates + metabolites, urine  Cutoff 200 ng/mL Benzodiazepine, urine              Cutoff 200 ng/mL Methadone, urine  Cutoff 300 ng/mL The urine drug screen provides only a preliminary, unconfirmed analytical test result and should not be used for non-medical purposes. Clinical consideration and professional judgment should be applied to any positive drug screen  result due to possible interfering substances. A more specific alternate chemical method must be used in order to obtain a confirmed analytical result. Gas chromatography / mass spectrometry (GC/MS) is the preferred confirmat ory method. Performed at [facility], [address] [place], [place] [address]   Ethanol     Status: None   Collection Time: 08/18/18 10:09 PM  Result Value Ref Range   Alcohol, Ethyl (B) <10 <10 mg/dL    Comment: (NOTE) Lowest detectable limit for serum alcohol is 10 mg/dL. For medical purposes only. Performed at [facility], [address] [place], [place] [address]   POC Urine Pregnancy, ED     Status: None   Collection Time: 08/18/18 10:09 PM  Result Value Ref Range   Preg Test, Ur Negative Negative    Comment: Pt is on her period   Ct Head Wo Contrast  Result Date: 08/18/2018 CLINICAL DATA:  Slurred speech since last night. EXAM: CT HEAD WITHOUT CONTRAST TECHNIQUE: Contiguous axial images were obtained from the base of the skull through the vertex without intravenous contrast. COMPARISON:  Head CT report dated 09/20/2002. FINDINGS: Brain: Mild-to-moderate diffuse enlargement of the ventricles and subarachnoid spaces. Mild patchy white matter low density in both cerebral hemispheres. No intracranial hemorrhage, mass lesion or CT evidence of acute infarction. Vascular: No hyperdense vessel or unexpected calcification. Skull: Normal. Negative for fracture or focal lesion. Sinuses/Orbits: Unremarkable. Other: None. IMPRESSION: 1. No acute abnormality. 2. Mild to moderate diffuse cerebral and cerebellar atrophy, not normally seen at this age. 3. Mild chronic small vessel white matter ischemic changes in both cerebral hemispheres, also not normally seen at this age. Electronically Signed   By: Claudie Revering M.D.   On: 08/18/2018 19:08   Mr Jeri Cos And Wo Contrast  Result Date: 08/19/2018 CLINICAL DATA:  Initial evaluation for acute ataxia,  sudden onset speech difficulty, left leg weakness. EXAM: MRI HEAD WITHOUT AND WITH CONTRAST TECHNIQUE: Multiplanar, multiecho pulse sequences of the brain and surrounding structures were obtained without and with intravenous contrast. CONTRAST:  7 cc of Gadavist. COMPARISON:  Prior CT from 08/18/2018 FINDINGS: Brain: Generalized cerebral and cerebellar atrophy, advanced for patient age. Extensive patchy and confluent T2/FLAIR hyperintensity seen involving the periventricular, deep, and subcortical white matter both cerebral hemispheres. Several of these foci are oriented perpendicular to the lateral ventricles. Involvement most pronounced at the right frontoparietal region and adjacent to the frontal horns of both lateral ventricles. Patchy signal abnormality also seen within the brainstem and cerebellum, most notably at the lateral left pons (series 9, image 9). Scattered diffusion abnormality seen about several of these lesions. With contrast administration, innumerable enhancing lesions seen scattered throughout the cerebral white matter, corresponding with several of these white matter lesions. Several of these lesions demonstrate full ring and incomplete rings of enhancement. Few small enhancing lesions noted at the left pons and cerebellum as well. Constellation of findings felt to be most consistent with demyelinating disease/multiple sclerosis. Note also made of a small white matter lesion within the right hemi cord at the upper cervical spine (series 7, image 2). No evidence for acute vascular infarct. Gray-white matter differentiation maintained without evidence for remote cortical infarction. No foci of susceptibility artifact to suggest acute or chronic intracranial hemorrhage. No mass  lesion, midline shift or mass effect. No hydrocephalus. No extra-axial fluid collection. Pituitary gland within normal limits. Midline structures intact. Corpus callosum somewhat thinned and atrophic. Vascular: Major  intracranial vascular flow voids maintained. Skull and upper cervical spine: Craniocervical junction within normal limits. Bone marrow signal intensity grossly normal. No focal marrow replacing lesion. No scalp soft tissue abnormality. Sinuses/Orbits: Globes and orbital soft tissues demonstrate no acute finding. Multiple complex retention cyst noted within the right greater than left maxillary sinuses. Paranasal sinuses are otherwise clear. Possible high-riding jugular bulb noted on the right (series 7, image 6). No mastoid effusion. Other: None. IMPRESSION: 1. Extensive abnormal cerebral white matter changes involving the supratentorial and infratentorial cerebral white matter as above, most suspicious for demyelinating disease. Multiple enhancing lesions throughout the brain following contrast administration compatible with active demyelination. 2. Underlying age advanced cerebral and cerebellar atrophy. 3. Focal cord signal abnormality within the right hemi cord of the upper cervical spine, also likely related to demyelinating disease. Further evaluation with imaging of the remaining neural axis suggested for complete evaluation. Electronically Signed   By: Jeannine Boga M.D.   On: 08/19/2018 01:29    Pending Labs Unresulted Labs (From admission, onward)    Start     Ordered   08/19/18 1156  Pregnancy, urine  Once,   STAT     08/19/18 1155   Signed and Held  Creatinine, serum  (enoxaparin (LOVENOX)    CrCl >/= 30 ml/min)  Weekly,   R    Comments:  while on enoxaparin therapy    Signed and Held   Signed and Held  TSH  Add-on,   R     Signed and Held          Vitals/Pain Today's Vitals   08/19/18 0828 08/19/18 1026 08/19/18 1139 08/19/18 1200  BP: 111/68 100/79    Pulse: 90 94    Resp: 16 16    Temp:      SpO2: 100% 100%    Weight:      Height:      PainSc:   Asleep 0-No pain    Isolation Precautions No active isolations  Medications Medications  sodium chloride flush  (NS) 0.9 % injection 3 mL (3 mLs Intravenous Not Given 08/18/18 2158)  acetaminophen (TYLENOL) tablet 650 mg (650 mg Oral Given 08/19/18 2130)    Or  acetaminophen (TYLENOL) suppository 650 mg ( Rectal See Alternative 08/19/18 0613)  ondansetron (ZOFRAN) tablet 4 mg ( Oral See Alternative 08/19/18 0604)    Or  ondansetron (ZOFRAN) injection 4 mg (4 mg Intravenous Given 08/19/18 0604)  methylPREDNISolone sodium succinate (SOLU-MEDROL) 1,000 mg in sodium chloride 0.9 % 50 mL IVPB (has no administration in time range)  pantoprazole (PROTONIX) EC tablet 40 mg (has no administration in time range)  gadobutrol (GADAVIST) 1 MMOL/ML injection 7 mL (7 mLs Intravenous Contrast Given 08/19/18 0043)    Mobility walks

## 2018-08-19 NOTE — ED Notes (Signed)
Awaiting solumedrol from pharmacy

## 2018-08-19 NOTE — ED Notes (Signed)
Report called to floor [place] RN  Advised that they would need to transport pt and they advised they would come to get her

## 2018-08-20 ENCOUNTER — Observation Stay: Payer: Self-pay

## 2018-08-20 DIAGNOSIS — R4781 Slurred speech: Secondary | ICD-10-CM | POA: Diagnosis not present

## 2018-08-20 DIAGNOSIS — E876 Hypokalemia: Secondary | ICD-10-CM | POA: Diagnosis not present

## 2018-08-20 DIAGNOSIS — R531 Weakness: Secondary | ICD-10-CM | POA: Diagnosis not present

## 2018-08-20 MED ORDER — GADOBUTROL 1 MMOL/ML IV SOLN
7.0000 mL | Freq: Once | INTRAVENOUS | Status: AC | PRN
  Administered 2018-08-20: 7 mL via INTRAVENOUS

## 2018-08-20 MED ORDER — DIPHENHYDRAMINE HCL 25 MG PO CAPS
25.0000 mg | ORAL_CAPSULE | Freq: Four times a day (QID) | ORAL | Status: DC | PRN
  Administered 2018-08-20: 20:00:00 25 mg via ORAL
  Filled 2018-08-20: qty 1

## 2018-08-20 NOTE — Evaluation (Signed)
Clinical/Bedside Swallow Evaluation Patient Details  Name: Desiree Chavez MRN: [id number] Date of Birth: [birth year]  Today's Date: 08/20/2018 Time: SLP Start Time (ACUTE ONLY): 0845 SLP Stop Time (ACUTE ONLY): 0930 SLP Time Calculation (min) (ACUTE ONLY): 45 min  Past Medical History:  Past Medical History:  Diagnosis Date  . Endometriosis    Past Surgical History:  Past Surgical History:  Procedure Laterality Date  . LAPAROSCOPIC ENDOMETRIOSIS FULGURATION N/A    HPI:  Per admitting H&P: The patient with past medical history of endometriosis presents to the emergency department complaining of difficulty speaking.  The patient called her boss this morning to report that she may be late due to inclement weather when her supervisor inquired as to whether the patient may be drunk.  The patient denies drinking alcohol or use of narcotic medication but admits that she is aware that her word finding ability and fluencey of speech is impaired.  The patient denies numbness in any of her extremities but admits to vague weakness of her left lower extremity.  CT of the brain was obtained which showed atrophic changes as well as white matter ischemic changes not appropriate for age which prompted the emergency department staff to call the hospitalist service for admission.   Assessment / Plan / Recommendation Clinical Impression  This very pleasant 38 y/o female who is being considered with a new dx of MS appears to present with functional swallowing abilities at bedside. No overt s/s aspiration observed with any consistency tested (thin liquid, puree). Oral phase and oral mech exam WFL. Adequate oral prep/coordination and A-P transit time with all consistencies tested. No oral residue observed. No overt s/s pharyngeal dysphagia observed. Swallow initiation appeared timely. Vocal quality remained clear thoughout evaluation. Pt denies any hx of dysphagia or any s/s aspiration with current regular diet.  Educated pt  re: aspiration precautions and general safe swallow recommendations. Pt re-stated safe swallow recommendations and stated agreement. Recommend continue with current regular diet with thin liquids, may continue to give meds whole with thin liquid. Discussed results of evaluation with nursing, nursing in agreement.  Did note mild dysarthria which was markedly improved with min verbal and visual cues for overarticulation and pacing (decreased rate of speech). Educated pt re: use of these compensatory strategies to improve clarity and precision of speech, pt successfully returned accurate use of these strategies. Recommend possible outpatient SLP services for treatment and management of dysarthria in light of possible new dx MS if slurred speech does not improve at d/c. SLP to sign off at this time with no further acute needs identified. SLP Visit Diagnosis: Dysphagia, unspecified (R13.10)    Aspiration Risk  No limitations    Diet Recommendation Regular;Thin liquid   Liquid Administration via: Cup;Straw Medication Administration: Whole meds with liquid Supervision: Patient able to self feed Compensations: Minimize environmental distractions;Slow rate;Small sips/bites Postural Changes: Seated upright at 90 degrees;Remain upright for at least 30 minutes after po intake(pt reports intermittent episodes of reflux)    Other  Recommendations Oral Care Recommendations: Oral care QID   Follow up Recommendations Outpatient SLP;Other (comment)(For dysarthria treatment)      Frequency and Duration Other (Comment)(OP tx recommended for dyarthria tx)          Prognosis Prognosis for Safe Diet Advancement: Good Barriers to Reach Goals: Severity of deficits      Swallow Study   General Date of Onset: 08/20/18 HPI: Per admitting H&P: The patient with past medical history of endometriosis presents to the  emergency department complaining of difficulty speaking.  The patient called her boss  this morning to report that she may be late due to inclement weather when her supervisor inquired as to whether the patient may be drunk.  The patient denies drinking alcohol or use of narcotic medication but admits that she is aware that her word finding ability and fluencey of speech is impaired.  The patient denies numbness in any of her extremities but admits to vague weakness of her left lower extremity.  CT of the brain was obtained which showed atrophic changes as well as white matter ischemic changes not appropriate for age which prompted the emergency department staff to call the hospitalist service for admission. Type of Study: Bedside Swallow Evaluation Diet Prior to this Study: Regular;Thin liquids Temperature Spikes Noted: No Respiratory Status: Room air History of Recent Intubation: No Behavior/Cognition: Alert;Cooperative;Pleasant mood Oral Cavity Assessment: Within Functional Limits Oral Care Completed by SLP: No Oral Cavity - Dentition: Adequate natural dentition Vision: Functional for self-feeding Self-Feeding Abilities: Able to feed self Patient Positioning: Upright in bed Baseline Vocal Quality: Normal    Oral/Motor/Sensory Function Overall Oral Motor/Sensory Function: Within functional limits   Ice Chips Ice chips: Within functional limits Presentation: Spoon   Thin Liquid Thin Liquid: Within functional limits Presentation: Cup;Self Fed;Straw    Nectar Thick Nectar Thick Liquid: Not tested   Honey Thick Honey Thick Liquid: Not tested   Puree Puree: Not tested   Solid     Solid: Within functional limits Presentation: Self Fed      Lissie Hinesley, MA, CCC-SLP 08/20/2018,9:42 AM

## 2018-08-20 NOTE — Progress Notes (Signed)
Brief History  Desiree Chavez is an 38 y.o. female with significant past medical history C/W endometriosis presented to the hospital complaining about 3 days of slurred speech. Patient's symptoms started on Wednesday night, patient stated that her speech was slurred and she could not get her words out, she thought that it was due to being tired, when she reported just to work on Thursday morning her manager asked her if she was drunk due to 'slurring her words', patient symptom has not improved so she decided to come the emergency room to seek medical attention. Patient also reported that her left lower extremity was weak, she could not lift it up when she tried, patient denied any visual changes, she denied any fall, she denies any numbness. Patient reported that couple months ago her left lower extremity was weak for couple of days and it resolved on its own.  Patient reported no incontinence of the stool or urine. In the emergency room patient had an MRI of the brain with and without contrast which shows multiple lesions that is consistent with active demyelinating disease she was admitted to the hospital for further work-up. Pt has a positive family history of multiple sclerosis ( Aunt from mother side) .  Subjective  Feels better, speech is improving, had LP yesterday, for MRI C/T/L spine today   Past Medical History Past Medical History:  Diagnosis Date  . Endometriosis     Past Surgical History Past Surgical History:  Procedure Laterality Date  . LAPAROSCOPIC ENDOMETRIOSIS FULGURATION N/A     Allergies Allergies  Allergen Reactions  . Codeine Rash    Home Medications Medications Prior to Admission  Medication Sig Dispense Refill  . fexofenadine (ALLEGRA) 180 MG tablet Take 180 mg by mouth daily.    . norgestimate-ethinyl estradiol (ORTHO-CYCLEN, 28,) 0.25-35 MG-MCG tablet Take 1 tablet by mouth daily. [facility] Medications . docusate sodium  100 mg  Oral BID  . enoxaparin (LOVENOX) injection  40 mg Subcutaneous Q24H  . loratadine  10 mg Oral Daily  . pantoprazole  40 mg Oral Daily  . sodium chloride flush  3 mL Intravenous Once     Objective  Intake/Output from previous day: 02/07 0701 - 02/08 0700 In: 2233.8 [P.O.:600; I.V.:1579.8; IV Piggyback:54] Out: -  Intake/Output this shift: No intake/output data recorded. Nutritional status:  Diet Order            Diet Heart Room service appropriate? Yes; Fluid consistency: Thin  Diet effective now               Physical Exam -  Vitals:   08/19/18 1402 08/19/18 1933 08/20/18 0428 08/20/18 0500  BP:  114/74 98/69   Pulse:  90 74   Resp:  18 18   Temp:  98.4 F (36.9 C) 97.8 F (36.6 C)   TempSrc:  Oral Oral   SpO2:  98% 97%   Weight: 74.4 kg   67.9 kg  Height: '5\' 6"'  (1.676 m)      General -  NAD lying in bed  Heart - Regular rate and rhythm - no murmer Lungs - Clear to auscultation Abdomen - Soft - non tender Extremities - Distal pulses intact - no edema Skin - Warm and dry  Neurologic Exam:  Mental Status:  Alert, oriented, thought content appropriate.  Speech slurred, no aphasia, normal naming  Able to follow 3 step commands without difficulty.  Cranial Nerves:  bilateral visual fields intact Pupils  were equal and reacted. Extraocular movements were full.  no facial numbness and no facial weakness.  hearing normal.  midline tongue extension  Motor: RUE 4/5 LUE 5/5 RLE 5/5 LLE 4/5 Sensory: Intact to light touch in all extremities. Deep Tendon Reflexes: HYPERREFLEXIC throughout  Cerebellar: dysmetria  Gait: not tested    LABORATORY RESULTS:  Basic Metabolic Panel: Recent Labs  Lab 08/18/18 1811  NA 137  K 3.4*  CL 104  CO2 24  GLUCOSE 107*  BUN 10  CREATININE 0.62  CALCIUM 9.0    Liver Function Tests: Recent Labs  Lab 08/18/18 1811  AST 23  ALT 18  ALKPHOS 23*  BILITOT 1.4*  PROT 7.8  ALBUMIN 4.4   No results for input(s):  LIPASE, AMYLASE in the last 168 hours. No results for input(s): AMMONIA in the last 168 hours.  CBC: Recent Labs  Lab 08/18/18 1811  WBC 6.3  NEUTROABS 4.2  HGB 12.5  HCT 37.7  MCV 87.7  PLT 208    Cardiac Enzymes: Recent Labs  Lab 08/18/18 1811  TROPONINI <0.03    Lipid Panel: No results for input(s): CHOL, TRIG, HDL, CHOLHDL, VLDL, LDLCALC in the last 168 hours.  CBG: No results for input(s): GLUCAP in the last 168 hours.  Microbiology:   Coagulation Studies: Recent Labs    08/18/18 1811  LABPROT 13.0  INR 0.99    Miscellaneous Labs:   IMAGING RESULTS Ct Head Wo Contrast  Result Date: 08/18/2018 CLINICAL DATA:  Slurred speech since last night. EXAM: CT HEAD WITHOUT CONTRAST TECHNIQUE: Contiguous axial images were obtained from the base of the skull through the vertex without intravenous contrast. COMPARISON:  Head CT report dated 09/20/2002. FINDINGS: Brain: Mild-to-moderate diffuse enlargement of the ventricles and subarachnoid spaces. Mild patchy white matter low density in both cerebral hemispheres. No intracranial hemorrhage, mass lesion or CT evidence of acute infarction. Vascular: No hyperdense vessel or unexpected calcification. Skull: Normal. Negative for fracture or focal lesion. Sinuses/Orbits: Unremarkable. Other: None. IMPRESSION: 1. No acute abnormality. 2. Mild to moderate diffuse cerebral and cerebellar atrophy, not normally seen at this age. 3. Mild chronic small vessel white matter ischemic changes in both cerebral hemispheres, also not normally seen at this age. Electronically Signed   By: Claudie Revering M.D.   On: 08/18/2018 19:08   Mr Jeri Cos And Wo Contrast  Result Date: 08/19/2018 CLINICAL DATA:  Initial evaluation for acute ataxia, sudden onset speech difficulty, left leg weakness. EXAM: MRI HEAD WITHOUT AND WITH CONTRAST TECHNIQUE: Multiplanar, multiecho pulse sequences of the brain and surrounding structures were obtained without and with  intravenous contrast. CONTRAST:  7 cc of Gadavist. COMPARISON:  Prior CT from 08/18/2018 FINDINGS: Brain: Generalized cerebral and cerebellar atrophy, advanced for patient age. Extensive patchy and confluent T2/FLAIR hyperintensity seen involving the periventricular, deep, and subcortical white matter both cerebral hemispheres. Several of these foci are oriented perpendicular to the lateral ventricles. Involvement most pronounced at the right frontoparietal region and adjacent to the frontal horns of both lateral ventricles. Patchy signal abnormality also seen within the brainstem and cerebellum, most notably at the lateral left pons (series 9, image 9). Scattered diffusion abnormality seen about several of these lesions. With contrast administration, innumerable enhancing lesions seen scattered throughout the cerebral white matter, corresponding with several of these white matter lesions. Several of these lesions demonstrate full ring and incomplete rings of enhancement. Few small enhancing lesions noted at the left pons and cerebellum as well. Constellation of findings felt  to be most consistent with demyelinating disease/multiple sclerosis. Note also made of a small white matter lesion within the right hemi cord at the upper cervical spine (series 7, image 2). No evidence for acute vascular infarct. Gray-white matter differentiation maintained without evidence for remote cortical infarction. No foci of susceptibility artifact to suggest acute or chronic intracranial hemorrhage. No mass lesion, midline shift or mass effect. No hydrocephalus. No extra-axial fluid collection. Pituitary gland within normal limits. Midline structures intact. Corpus callosum somewhat thinned and atrophic. Vascular: Major intracranial vascular flow voids maintained. Skull and upper cervical spine: Craniocervical junction within normal limits. Bone marrow signal intensity grossly normal. No focal marrow replacing lesion. No scalp soft  tissue abnormality. Sinuses/Orbits: Globes and orbital soft tissues demonstrate no acute finding. Multiple complex retention cyst noted within the right greater than left maxillary sinuses. Paranasal sinuses are otherwise clear. Possible high-riding jugular bulb noted on the right (series 7, image 6). No mastoid effusion. Other: None. IMPRESSION: 1. Extensive abnormal cerebral white matter changes involving the supratentorial and infratentorial cerebral white matter as above, most suspicious for demyelinating disease. Multiple enhancing lesions throughout the brain following contrast administration compatible with active demyelination. 2. Underlying age advanced cerebral and cerebellar atrophy. 3. Focal cord signal abnormality within the right hemi cord of the upper cervical spine, also likely related to demyelinating disease. Further evaluation with imaging of the remaining neural axis suggested for complete evaluation. Electronically Signed   By: Jeannine Boga M.D.   On: 08/19/2018 01:29   Mr Cervical Spine W Wo Contrast  Result Date: 08/20/2018 CLINICAL DATA:  Demyelinating findings on brain MRI. EXAM: MRI TOTAL SPINE WITHOUT AND WITH CONTRAST TECHNIQUE: Multisequence MR imaging of the spine from the cervical spine to the sacrum was performed prior to and following IV contrast administration. CONTRAST:  7 cc Gadavist intravenous COMPARISON:  None. FINDINGS: MRI CERVICAL SPINE FINDINGS Alignment: Normal Vertebrae: No fracture, evidence of discitis, or bone lesion. Cord: Short-segment T2 hyperintensity in the ventral cord at the cervicomedullary junction is convincing on sagittal T2 weighted imaging and STIR. No convincing other cord signal abnormality when allowing for extensive motion artifact Posterior Fossa, vertebral arteries, paraspinal tissues: Negative Disc levels: Noncompressive central disc protrusions at C4-5 to C6-7. Bulge and uncovertebral spurring at C5-6 causes mild encroachment on the  right foramen. Widely patent spinal canal MRI THORACIC SPINE FINDINGS Alignment:  Normal Vertebrae: No fracture, evidence of discitis, or bone lesion. Cord: Normal signal and morphology when accounting for motion artifact. Paraspinal and other soft tissues: Negative Disc levels: No impingement or degenerative disease. MRI LUMBAR SPINE FINDINGS Segmentation:  Normal Alignment:  Normal Vertebrae:  No fracture, evidence of discitis, or bone lesion. Conus medullaris: Extends to the T12-L1 level has an unremarkable appearance on the Thoracic study. Paraspinal and other soft tissues: Negative Disc levels: L5-S1 mild disc desiccation and narrowing with shallow central protrusion not contacting any of the neural elements. There is mild facet spurring at L4-5. IMPRESSION: 1. Evaluation the cord is moderately degraded by motion artifact. 2. Suspected nonenhancing demyelinating focus at the cervicomedullary junction ventrally. 3. The remainder of the cord and the cauda equina is negative. 4. Mild degenerative changes without impingement. Electronically Signed   By: Monte Fantasia M.D.   On: 08/20/2018 07:19   Mr Thoracic Spine W Wo Contrast  Result Date: 08/20/2018 CLINICAL DATA:  Demyelinating findings on brain MRI. EXAM: MRI TOTAL SPINE WITHOUT AND WITH CONTRAST TECHNIQUE: Multisequence MR imaging of the spine from the cervical spine  to the sacrum was performed prior to and following IV contrast administration. CONTRAST:  7 cc Gadavist intravenous COMPARISON:  None. FINDINGS: MRI CERVICAL SPINE FINDINGS Alignment: Normal Vertebrae: No fracture, evidence of discitis, or bone lesion. Cord: Short-segment T2 hyperintensity in the ventral cord at the cervicomedullary junction is convincing on sagittal T2 weighted imaging and STIR. No convincing other cord signal abnormality when allowing for extensive motion artifact Posterior Fossa, vertebral arteries, paraspinal tissues: Negative Disc levels: Noncompressive central disc  protrusions at C4-5 to C6-7. Bulge and uncovertebral spurring at C5-6 causes mild encroachment on the right foramen. Widely patent spinal canal MRI THORACIC SPINE FINDINGS Alignment:  Normal Vertebrae: No fracture, evidence of discitis, or bone lesion. Cord: Normal signal and morphology when accounting for motion artifact. Paraspinal and other soft tissues: Negative Disc levels: No impingement or degenerative disease. MRI LUMBAR SPINE FINDINGS Segmentation:  Normal Alignment:  Normal Vertebrae:  No fracture, evidence of discitis, or bone lesion. Conus medullaris: Extends to the T12-L1 level has an unremarkable appearance on the Thoracic study. Paraspinal and other soft tissues: Negative Disc levels: L5-S1 mild disc desiccation and narrowing with shallow central protrusion not contacting any of the neural elements. There is mild facet spurring at L4-5. IMPRESSION: 1. Evaluation the cord is moderately degraded by motion artifact. 2. Suspected nonenhancing demyelinating focus at the cervicomedullary junction ventrally. 3. The remainder of the cord and the cauda equina is negative. 4. Mild degenerative changes without impingement. Electronically Signed   By: Monte Fantasia M.D.   On: 08/20/2018 07:19   Mr Lumbar Spine W Wo Contrast  Result Date: 08/20/2018 CLINICAL DATA:  Demyelinating findings on brain MRI. EXAM: MRI TOTAL SPINE WITHOUT AND WITH CONTRAST TECHNIQUE: Multisequence MR imaging of the spine from the cervical spine to the sacrum was performed prior to and following IV contrast administration. CONTRAST:  7 cc Gadavist intravenous COMPARISON:  None. FINDINGS: MRI CERVICAL SPINE FINDINGS Alignment: Normal Vertebrae: No fracture, evidence of discitis, or bone lesion. Cord: Short-segment T2 hyperintensity in the ventral cord at the cervicomedullary junction is convincing on sagittal T2 weighted imaging and STIR. No convincing other cord signal abnormality when allowing for extensive motion artifact Posterior  Fossa, vertebral arteries, paraspinal tissues: Negative Disc levels: Noncompressive central disc protrusions at C4-5 to C6-7. Bulge and uncovertebral spurring at C5-6 causes mild encroachment on the right foramen. Widely patent spinal canal MRI THORACIC SPINE FINDINGS Alignment:  Normal Vertebrae: No fracture, evidence of discitis, or bone lesion. Cord: Normal signal and morphology when accounting for motion artifact. Paraspinal and other soft tissues: Negative Disc levels: No impingement or degenerative disease. MRI LUMBAR SPINE FINDINGS Segmentation:  Normal Alignment:  Normal Vertebrae:  No fracture, evidence of discitis, or bone lesion. Conus medullaris: Extends to the T12-L1 level has an unremarkable appearance on the Thoracic study. Paraspinal and other soft tissues: Negative Disc levels: L5-S1 mild disc desiccation and narrowing with shallow central protrusion not contacting any of the neural elements. There is mild facet spurring at L4-5. IMPRESSION: 1. Evaluation the cord is moderately degraded by motion artifact. 2. Suspected nonenhancing demyelinating focus at the cervicomedullary junction ventrally. 3. The remainder of the cord and the cauda equina is negative. 4. Mild degenerative changes without impingement. Electronically Signed   By: Monte Fantasia M.D.   On: 08/20/2018 07:19   Dg Fl Guided Lumbar Puncture  Result Date: 08/19/2018 CLINICAL DATA:  Multiple sclerosis. EXAM: DIAGNOSTIC LUMBAR PUNCTURE UNDER FLUOROSCOPIC GUIDANCE FLUOROSCOPY TIME:  Fluoroscopy Time:  0 minutes 48 seconds.  Radiation Exposure Index (if provided by the fluoroscopic device): 10.8 mGy Number of Acquired Spot Images: 1 PROCEDURE: Informed consent was obtained from the patient prior to the procedure, including potential complications of headache, bleeding, allergy, and pain. With the patient prone, the lower back was prepped with Betadine. 1% Lidocaine was used for local anesthesia. Lumbar puncture was performed at the  L3-L4 level using a 22 gauge needle with return of clear CSF. 10 Ml of CSF were obtained for laboratory studies. The patient tolerated the procedure well and there were no apparent complications. IMPRESSION: Successful lumbar puncture with CSF sent requested labs. 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  -CSF: Protein 38, WBC 4, RBC 734,  oligoclonal IgG pending , JC virus pending - MRI C/T/L spine W contrast showing multiple non enhancing lesions  - Continue high-dose steroids for 5 days, on methylprednisone thousand milligrams IV 2/5  -please check ANA/HIV/ESR -PT/OT and speech evaluation -DVT prophylaxis    Arnaldo Natal, MD

## 2018-08-20 NOTE — Progress Notes (Signed)
[facility] NAME: Desiree Chavez    MR#:  [id number]  DATE OF BIRTH:  [birth year]  SUBJECTIVE:  CHIEF COMPLAINT:   Chief Complaint  Patient presents with  . Aphasia    prior to going to bed last night   Patient seen today Speech appears to be better Weakness in the left leg is also improving Tolerating diet okay  REVIEW OF SYSTEMS:    ROS  CONSTITUTIONAL: No documented fever. No fatigue, weakness. No weight gain, no weight loss.  EYES: No blurry or double vision.  ENT: No tinnitus. No postnasal drip. No redness of the oropharynx.  RESPIRATORY: No cough, no wheeze, no hemoptysis. No dyspnea.  CARDIOVASCULAR: No chest pain. No orthopnea. No palpitations. No syncope.  GASTROINTESTINAL: No nausea, no vomiting or diarrhea. No abdominal pain. No melena or hematochezia.  GENITOURINARY: No dysuria or hematuria.  ENDOCRINE: No polyuria or nocturia. No heat or cold intolerance.  HEMATOLOGY: No anemia. No bruising. No bleeding.  INTEGUMENTARY: No rashes. No lesions.  MUSCULOSKELETAL: No arthritis. No swelling. No gout.  NEUROLOGIC: No numbness, tingling, or ataxia. No seizure-type activity.  Slurred speech sometimes PSYCHIATRIC: No anxiety. No insomnia. No ADD.   DRUG ALLERGIES:   Allergies  Allergen Reactions  . Codeine Rash    VITALS:  Blood pressure 98/69, pulse 74, temperature 97.8 F (36.6 C), temperature source Oral, resp. rate 18, height 5\' 6"  (1.676 m), weight 67.9 kg, last menstrual period 08/17/2018, SpO2 97 %.  PHYSICAL EXAMINATION:   Physical Exam  GENERAL:  38 y.o.-year-old patient lying in the bed with no acute distress.  EYES: Pupils equal, round, reactive to light and accommodation. No scleral icterus. Extraocular muscles intact.  HEENT: Head atraumatic, normocephalic. Oropharynx and nasopharynx clear.  NECK:  Supple, no jugular venous distention. No thyroid enlargement, no tenderness.  LUNGS: Normal  breath sounds bilaterally, no wheezing, rales, rhonchi. No use of accessory muscles of respiration.  CARDIOVASCULAR: S1, S2 normal. No murmurs, rubs, or gallops.  ABDOMEN: Soft, nontender, nondistended. Bowel sounds present. No organomegaly or mass.  EXTREMITIES: No cyanosis, clubbing or edema b/l.    NEUROLOGIC: Dysarthria noted. No focal Motor or sensory deficits b/l.   PSYCHIATRIC: The patient is alert and oriented x 3.  SKIN: No obvious rash, lesion, or ulcer.   LABORATORY PANEL:   CBC Recent Labs  Lab 08/18/18 1811  WBC 6.3  HGB 12.5  HCT 37.7  PLT 208   ------------------------------------------------------------------------------------------------------------------ Chemistries  Recent Labs  Lab 08/18/18 1811  NA 137  K 3.4*  CL 104  CO2 24  GLUCOSE 107*  BUN 10  CREATININE 0.62  CALCIUM 9.0  AST 23  ALT 18  ALKPHOS 23*  BILITOT 1.4*   ------------------------------------------------------------------------------------------------------------------  Cardiac Enzymes Recent Labs  Lab 08/18/18 1811  TROPONINI <0.03   ------------------------------------------------------------------------------------------------------------------  RADIOLOGY:  Ct Head Wo Contrast  Result Date: 08/18/2018 CLINICAL DATA:  Slurred speech since last night. EXAM: CT HEAD WITHOUT CONTRAST TECHNIQUE: Contiguous axial images were obtained from the base of the skull through the vertex without intravenous contrast. COMPARISON:  Head CT report dated 09/20/2002. FINDINGS: Brain: Mild-to-moderate diffuse enlargement of the ventricles and subarachnoid spaces. Mild patchy white matter low density in both cerebral hemispheres. No intracranial hemorrhage, mass lesion or CT evidence of acute infarction. Vascular: No hyperdense vessel or unexpected calcification. Skull: Normal. Negative for fracture or focal lesion. Sinuses/Orbits: Unremarkable. Other: None. IMPRESSION: 1. No acute abnormality. 2.  Mild to moderate diffuse  cerebral and cerebellar atrophy, not normally seen at this age. 3. Mild chronic small vessel white matter ischemic changes in both cerebral hemispheres, also not normally seen at this age. Electronically Signed   By: Claudie Revering M.D.   On: 08/18/2018 19:08   Mr Desiree Chavez And Wo Contrast  Result Date: 08/19/2018 CLINICAL DATA:  Initial evaluation for acute ataxia, sudden onset speech difficulty, left leg weakness. EXAM: MRI HEAD WITHOUT AND WITH CONTRAST TECHNIQUE: Multiplanar, multiecho pulse sequences of the brain and surrounding structures were obtained without and with intravenous contrast. CONTRAST:  7 cc of Gadavist. COMPARISON:  Prior CT from 08/18/2018 FINDINGS: Brain: Generalized cerebral and cerebellar atrophy, advanced for patient age. Extensive patchy and confluent T2/FLAIR hyperintensity seen involving the periventricular, deep, and subcortical white matter both cerebral hemispheres. Several of these foci are oriented perpendicular to the lateral ventricles. Involvement most pronounced at the right frontoparietal region and adjacent to the frontal horns of both lateral ventricles. Patchy signal abnormality also seen within the brainstem and cerebellum, most notably at the lateral left pons (series 9, image 9). Scattered diffusion abnormality seen about several of these lesions. With contrast administration, innumerable enhancing lesions seen scattered throughout the cerebral white matter, corresponding with several of these white matter lesions. Several of these lesions demonstrate full ring and incomplete rings of enhancement. Few small enhancing lesions noted at the left pons and cerebellum as well. Constellation of findings felt to be most consistent with demyelinating disease/multiple sclerosis. Note also made of a small white matter lesion within the right hemi cord at the upper cervical spine (series 7, image 2). No evidence for acute vascular infarct. Gray-white matter  differentiation maintained without evidence for remote cortical infarction. No foci of susceptibility artifact to suggest acute or chronic intracranial hemorrhage. No mass lesion, midline shift or mass effect. No hydrocephalus. No extra-axial fluid collection. Pituitary gland within normal limits. Midline structures intact. Corpus callosum somewhat thinned and atrophic. Vascular: Major intracranial vascular flow voids maintained. Skull and upper cervical spine: Craniocervical junction within normal limits. Bone marrow signal intensity grossly normal. No focal marrow replacing lesion. No scalp soft tissue abnormality. Sinuses/Orbits: Globes and orbital soft tissues demonstrate no acute finding. Multiple complex retention cyst noted within the right greater than left maxillary sinuses. Paranasal sinuses are otherwise clear. Possible high-riding jugular bulb noted on the right (series 7, image 6). No mastoid effusion. Other: None. IMPRESSION: 1. Extensive abnormal cerebral white matter changes involving the supratentorial and infratentorial cerebral white matter as above, most suspicious for demyelinating disease. Multiple enhancing lesions throughout the brain following contrast administration compatible with active demyelination. 2. Underlying age advanced cerebral and cerebellar atrophy. 3. Focal cord signal abnormality within the right hemi cord of the upper cervical spine, also likely related to demyelinating disease. Further evaluation with imaging of the remaining neural axis suggested for complete evaluation. Electronically Signed   By: Jeannine Boga M.D.   On: 08/19/2018 01:29   Mr Cervical Spine W Wo Contrast  Result Date: 08/20/2018 CLINICAL DATA:  Demyelinating findings on brain MRI. EXAM: MRI TOTAL SPINE WITHOUT AND WITH CONTRAST TECHNIQUE: Multisequence MR imaging of the spine from the cervical spine to the sacrum was performed prior to and following IV contrast administration. CONTRAST:  7 cc  Gadavist intravenous COMPARISON:  None. FINDINGS: MRI CERVICAL SPINE FINDINGS Alignment: Normal Vertebrae: No fracture, evidence of discitis, or bone lesion. Cord: Short-segment T2 hyperintensity in the ventral cord at the cervicomedullary junction is convincing on sagittal T2 weighted imaging  and STIR. No convincing other cord signal abnormality when allowing for extensive motion artifact Posterior Fossa, vertebral arteries, paraspinal tissues: Negative Disc levels: Noncompressive central disc protrusions at C4-5 to C6-7. Bulge and uncovertebral spurring at C5-6 causes mild encroachment on the right foramen. Widely patent spinal canal MRI THORACIC SPINE FINDINGS Alignment:  Normal Vertebrae: No fracture, evidence of discitis, or bone lesion. Cord: Normal signal and morphology when accounting for motion artifact. Paraspinal and other soft tissues: Negative Disc levels: No impingement or degenerative disease. MRI LUMBAR SPINE FINDINGS Segmentation:  Normal Alignment:  Normal Vertebrae:  No fracture, evidence of discitis, or bone lesion. Conus medullaris: Extends to the T12-L1 level has an unremarkable appearance on the Thoracic study. Paraspinal and other soft tissues: Negative Disc levels: L5-S1 mild disc desiccation and narrowing with shallow central protrusion not contacting any of the neural elements. There is mild facet spurring at L4-5. IMPRESSION: 1. Evaluation the cord is moderately degraded by motion artifact. 2. Suspected nonenhancing demyelinating focus at the cervicomedullary junction ventrally. 3. The remainder of the cord and the cauda equina is negative. 4. Mild degenerative changes without impingement. Electronically Signed   By: Monte Fantasia M.D.   On: 08/20/2018 07:19   Mr Thoracic Spine W Wo Contrast  Result Date: 08/20/2018 CLINICAL DATA:  Demyelinating findings on brain MRI. EXAM: MRI TOTAL SPINE WITHOUT AND WITH CONTRAST TECHNIQUE: Multisequence MR imaging of the spine from the cervical  spine to the sacrum was performed prior to and following IV contrast administration. CONTRAST:  7 cc Gadavist intravenous COMPARISON:  None. FINDINGS: MRI CERVICAL SPINE FINDINGS Alignment: Normal Vertebrae: No fracture, evidence of discitis, or bone lesion. Cord: Short-segment T2 hyperintensity in the ventral cord at the cervicomedullary junction is convincing on sagittal T2 weighted imaging and STIR. No convincing other cord signal abnormality when allowing for extensive motion artifact Posterior Fossa, vertebral arteries, paraspinal tissues: Negative Disc levels: Noncompressive central disc protrusions at C4-5 to C6-7. Bulge and uncovertebral spurring at C5-6 causes mild encroachment on the right foramen. Widely patent spinal canal MRI THORACIC SPINE FINDINGS Alignment:  Normal Vertebrae: No fracture, evidence of discitis, or bone lesion. Cord: Normal signal and morphology when accounting for motion artifact. Paraspinal and other soft tissues: Negative Disc levels: No impingement or degenerative disease. MRI LUMBAR SPINE FINDINGS Segmentation:  Normal Alignment:  Normal Vertebrae:  No fracture, evidence of discitis, or bone lesion. Conus medullaris: Extends to the T12-L1 level has an unremarkable appearance on the Thoracic study. Paraspinal and other soft tissues: Negative Disc levels: L5-S1 mild disc desiccation and narrowing with shallow central protrusion not contacting any of the neural elements. There is mild facet spurring at L4-5. IMPRESSION: 1. Evaluation the cord is moderately degraded by motion artifact. 2. Suspected nonenhancing demyelinating focus at the cervicomedullary junction ventrally. 3. The remainder of the cord and the cauda equina is negative. 4. Mild degenerative changes without impingement. Electronically Signed   By: Monte Fantasia M.D.   On: 08/20/2018 07:19   Mr Lumbar Spine W Wo Contrast  Result Date: 08/20/2018 CLINICAL DATA:  Demyelinating findings on brain MRI. EXAM: MRI TOTAL  SPINE WITHOUT AND WITH CONTRAST TECHNIQUE: Multisequence MR imaging of the spine from the cervical spine to the sacrum was performed prior to and following IV contrast administration. CONTRAST:  7 cc Gadavist intravenous COMPARISON:  None. FINDINGS: MRI CERVICAL SPINE FINDINGS Alignment: Normal Vertebrae: No fracture, evidence of discitis, or bone lesion. Cord: Short-segment T2 hyperintensity in the ventral cord at the cervicomedullary junction is  convincing on sagittal T2 weighted imaging and STIR. No convincing other cord signal abnormality when allowing for extensive motion artifact Posterior Fossa, vertebral arteries, paraspinal tissues: Negative Disc levels: Noncompressive central disc protrusions at C4-5 to C6-7. Bulge and uncovertebral spurring at C5-6 causes mild encroachment on the right foramen. Widely patent spinal canal MRI THORACIC SPINE FINDINGS Alignment:  Normal Vertebrae: No fracture, evidence of discitis, or bone lesion. Cord: Normal signal and morphology when accounting for motion artifact. Paraspinal and other soft tissues: Negative Disc levels: No impingement or degenerative disease. MRI LUMBAR SPINE FINDINGS Segmentation:  Normal Alignment:  Normal Vertebrae:  No fracture, evidence of discitis, or bone lesion. Conus medullaris: Extends to the T12-L1 level has an unremarkable appearance on the Thoracic study. Paraspinal and other soft tissues: Negative Disc levels: L5-S1 mild disc desiccation and narrowing with shallow central protrusion not contacting any of the neural elements. There is mild facet spurring at L4-5. IMPRESSION: 1. Evaluation the cord is moderately degraded by motion artifact. 2. Suspected nonenhancing demyelinating focus at the cervicomedullary junction ventrally. 3. The remainder of the cord and the cauda equina is negative. 4. Mild degenerative changes without impingement. Electronically Signed   By: Monte Fantasia M.D.   On: 08/20/2018 07:19   Dg Fl Guided Lumbar  Puncture  Result Date: 08/19/2018 CLINICAL DATA:  Multiple sclerosis. EXAM: DIAGNOSTIC LUMBAR PUNCTURE UNDER FLUOROSCOPIC GUIDANCE FLUOROSCOPY TIME:  Fluoroscopy Time:  0 minutes 48 seconds. Radiation Exposure Index (if provided by the fluoroscopic device): 10.8 mGy Number of Acquired Spot Images: 1 PROCEDURE: Informed consent was obtained from the patient prior to the procedure, including potential complications of headache, bleeding, allergy, and pain. With the patient prone, the lower back was prepped with Betadine. 1% Lidocaine was used for local anesthesia. Lumbar puncture was performed at the L3-L4 level using a 22 gauge needle with return of clear CSF. 10 Ml of CSF were obtained for laboratory studies. The patient tolerated the procedure well and there were no apparent complications. IMPRESSION: Successful lumbar puncture with CSF sent requested labs. Electronically Signed   By: Marcello Moores  Register   On: 08/19/2018 14:26     ASSESSMENT AND PLAN:   38 year old female patient currently under hospitalist service with difficulty in speech  -Multiple sclerosis Status post neurology evaluation On high-dose IV Solu-Medrol thousand milligrams daily for 5 days  -Dysarthria Speech therapy follow-up  -Weakness in the left leg Physical therapy evaluation Some symptoms could be from the global white matter changes  -DVT prophylaxis subcu Lovenox daily  -GI prophylaxis with oral Protonix  All the records are reviewed and case discussed with Care Management/Social Worker. Management plans discussed with the patient, family and they are in agreement.  CODE STATUS: Full code  DVT Prophylaxis: SCDs  TOTAL TIME TAKING CARE OF THIS PATIENT: 37 minutes.   POSSIBLE D/C IN 2 to 3 DAYS, DEPENDING ON CLINICAL CONDITION.  Saundra Shelling M.D on 08/20/2018 at 12:07 PM  Between 7am to 6pm - Pager - [phone]  After 6pm go to [email or web address] - password [email or web address] [facility]  Office   [phone]  CC: Primary care physician; Volney American, PA-C  Note: This dictation was prepared with Dragon dictation along with smaller phrase technology. Any transcriptional errors that result from this process are unintentional.

## 2018-08-21 DIAGNOSIS — R4781 Slurred speech: Secondary | ICD-10-CM | POA: Diagnosis not present

## 2018-08-21 DIAGNOSIS — R531 Weakness: Secondary | ICD-10-CM | POA: Diagnosis not present

## 2018-08-21 DIAGNOSIS — E876 Hypokalemia: Secondary | ICD-10-CM | POA: Diagnosis not present

## 2018-08-21 LAB — POTASSIUM: Potassium: 3.8 mmol/L (ref 3.5–5.1)

## 2018-08-21 MED ORDER — IBUPROFEN 400 MG PO TABS
400.0000 mg | ORAL_TABLET | Freq: Four times a day (QID) | ORAL | Status: DC | PRN
  Administered 2018-08-21 – 2018-08-23 (×2): 400 mg via ORAL
  Filled 2018-08-21 (×2): qty 1

## 2018-08-21 NOTE — Progress Notes (Signed)
[facility] NAME: Desiree Chavez    MR#:  [id number]  DATE OF BIRTH:  02/27/1981  SUBJECTIVE:  CHIEF COMPLAINT:   Chief Complaint  Patient presents with  . Aphasia    prior to going to bed last night   Patient seen today Speech appears to be better Weakness in the left leg has improved Tolerating diet okay No blurry vision No chest pain  REVIEW OF SYSTEMS:    ROS  CONSTITUTIONAL: No documented fever. No fatigue, weakness. No weight gain, no weight loss.  EYES: No blurry or double vision.  ENT: No tinnitus. No postnasal drip. No redness of the oropharynx.  RESPIRATORY: No cough, no wheeze, no hemoptysis. No dyspnea.  CARDIOVASCULAR: No chest pain. No orthopnea. No palpitations. No syncope.  GASTROINTESTINAL: No nausea, no vomiting or diarrhea. No abdominal pain. No melena or hematochezia.  GENITOURINARY: No dysuria or hematuria.  ENDOCRINE: No polyuria or nocturia. No heat or cold intolerance.  HEMATOLOGY: No anemia. No bruising. No bleeding.  INTEGUMENTARY: No rashes. No lesions.  MUSCULOSKELETAL: No arthritis. No swelling. No gout.  NEUROLOGIC: No numbness, tingling, or ataxia. No seizure-type activity.  Slurred speech improved PSYCHIATRIC: No anxiety. No insomnia. No ADD.   DRUG ALLERGIES:   Allergies  Allergen Reactions  . Codeine Rash    VITALS:  Blood pressure 108/70, pulse 78, temperature 98.3 F (36.8 C), temperature source Oral, resp. rate 15, height 5\' 6"  (1.676 m), weight 66.6 kg, last menstrual period 08/17/2018, SpO2 99 %.  PHYSICAL EXAMINATION:   Physical Exam  GENERAL:  38 y.o.-year-old patient lying in the bed with no acute distress.  EYES: Pupils equal, round, reactive to light and accommodation. No scleral icterus. Extraocular muscles intact.  HEENT: Head atraumatic, normocephalic. Oropharynx and nasopharynx clear.  NECK:  Supple, no jugular venous distention. No thyroid enlargement, no  tenderness.  LUNGS: Normal breath sounds bilaterally, no wheezing, rales, rhonchi. No use of accessory muscles of respiration.  CARDIOVASCULAR: S1, S2 normal. No murmurs, rubs, or gallops.  ABDOMEN: Soft, nontender, nondistended. Bowel sounds present. No organomegaly or mass.  EXTREMITIES: No cyanosis, clubbing or edema b/l.    NEUROLOGIC: Dysarthria improved. No focal Motor or sensory deficits b/l.   PSYCHIATRIC: The patient is alert and oriented x 3.  SKIN: No obvious rash, lesion, or ulcer.   LABORATORY PANEL:   CBC Recent Labs  Lab 08/18/18 1811  WBC 6.3  HGB 12.5  HCT 37.7  PLT 208   ------------------------------------------------------------------------------------------------------------------ Chemistries  Recent Labs  Lab 08/18/18 1811 08/21/18 0323  NA 137  --   K 3.4* 3.8  CL 104  --   CO2 24  --   GLUCOSE 107*  --   BUN 10  --   CREATININE 0.62  --   CALCIUM 9.0  --   AST 23  --   ALT 18  --   ALKPHOS 23*  --   BILITOT 1.4*  --    ------------------------------------------------------------------------------------------------------------------  Cardiac Enzymes Recent Labs  Lab 08/18/18 1811  TROPONINI <0.03   ------------------------------------------------------------------------------------------------------------------  RADIOLOGY:  Mr Cervical Spine W Wo Contrast  Result Date: 08/20/2018 CLINICAL DATA:  Demyelinating findings on brain MRI. EXAM: MRI TOTAL SPINE WITHOUT AND WITH CONTRAST TECHNIQUE: Multisequence MR imaging of the spine from the cervical spine to the sacrum was performed prior to and following IV contrast administration. CONTRAST:  7 cc Gadavist intravenous COMPARISON:  None. FINDINGS: MRI CERVICAL SPINE FINDINGS Alignment: Normal Vertebrae: No  fracture, evidence of discitis, or bone lesion. Cord: Short-segment T2 hyperintensity in the ventral cord at the cervicomedullary junction is convincing on sagittal T2 weighted imaging and STIR.  No convincing other cord signal abnormality when allowing for extensive motion artifact Posterior Fossa, vertebral arteries, paraspinal tissues: Negative Disc levels: Noncompressive central disc protrusions at C4-5 to C6-7. Bulge and uncovertebral spurring at C5-6 causes mild encroachment on the right foramen. Widely patent spinal canal MRI THORACIC SPINE FINDINGS Alignment:  Normal Vertebrae: No fracture, evidence of discitis, or bone lesion. Cord: Normal signal and morphology when accounting for motion artifact. Paraspinal and other soft tissues: Negative Disc levels: No impingement or degenerative disease. MRI LUMBAR SPINE FINDINGS Segmentation:  Normal Alignment:  Normal Vertebrae:  No fracture, evidence of discitis, or bone lesion. Conus medullaris: Extends to the T12-L1 level has an unremarkable appearance on the Thoracic study. Paraspinal and other soft tissues: Negative Disc levels: L5-S1 mild disc desiccation and narrowing with shallow central protrusion not contacting any of the neural elements. There is mild facet spurring at L4-5. IMPRESSION: 1. Evaluation the cord is moderately degraded by motion artifact. 2. Suspected nonenhancing demyelinating focus at the cervicomedullary junction ventrally. 3. The remainder of the cord and the cauda equina is negative. 4. Mild degenerative changes without impingement. Electronically Signed   By: Monte Fantasia M.D.   On: 08/20/2018 07:19   Mr Thoracic Spine W Wo Contrast  Result Date: 08/20/2018 CLINICAL DATA:  Demyelinating findings on brain MRI. EXAM: MRI TOTAL SPINE WITHOUT AND WITH CONTRAST TECHNIQUE: Multisequence MR imaging of the spine from the cervical spine to the sacrum was performed prior to and following IV contrast administration. CONTRAST:  7 cc Gadavist intravenous COMPARISON:  None. FINDINGS: MRI CERVICAL SPINE FINDINGS Alignment: Normal Vertebrae: No fracture, evidence of discitis, or bone lesion. Cord: Short-segment T2 hyperintensity in the  ventral cord at the cervicomedullary junction is convincing on sagittal T2 weighted imaging and STIR. No convincing other cord signal abnormality when allowing for extensive motion artifact Posterior Fossa, vertebral arteries, paraspinal tissues: Negative Disc levels: Noncompressive central disc protrusions at C4-5 to C6-7. Bulge and uncovertebral spurring at C5-6 causes mild encroachment on the right foramen. Widely patent spinal canal MRI THORACIC SPINE FINDINGS Alignment:  Normal Vertebrae: No fracture, evidence of discitis, or bone lesion. Cord: Normal signal and morphology when accounting for motion artifact. Paraspinal and other soft tissues: Negative Disc levels: No impingement or degenerative disease. MRI LUMBAR SPINE FINDINGS Segmentation:  Normal Alignment:  Normal Vertebrae:  No fracture, evidence of discitis, or bone lesion. Conus medullaris: Extends to the T12-L1 level has an unremarkable appearance on the Thoracic study. Paraspinal and other soft tissues: Negative Disc levels: L5-S1 mild disc desiccation and narrowing with shallow central protrusion not contacting any of the neural elements. There is mild facet spurring at L4-5. IMPRESSION: 1. Evaluation the cord is moderately degraded by motion artifact. 2. Suspected nonenhancing demyelinating focus at the cervicomedullary junction ventrally. 3. The remainder of the cord and the cauda equina is negative. 4. Mild degenerative changes without impingement. Electronically Signed   By: Monte Fantasia M.D.   On: 08/20/2018 07:19   Mr Lumbar Spine W Wo Contrast  Result Date: 08/20/2018 CLINICAL DATA:  Demyelinating findings on brain MRI. EXAM: MRI TOTAL SPINE WITHOUT AND WITH CONTRAST TECHNIQUE: Multisequence MR imaging of the spine from the cervical spine to the sacrum was performed prior to and following IV contrast administration. CONTRAST:  7 cc Gadavist intravenous COMPARISON:  None. FINDINGS: MRI CERVICAL  SPINE FINDINGS Alignment: Normal Vertebrae:  No fracture, evidence of discitis, or bone lesion. Cord: Short-segment T2 hyperintensity in the ventral cord at the cervicomedullary junction is convincing on sagittal T2 weighted imaging and STIR. No convincing other cord signal abnormality when allowing for extensive motion artifact Posterior Fossa, vertebral arteries, paraspinal tissues: Negative Disc levels: Noncompressive central disc protrusions at C4-5 to C6-7. Bulge and uncovertebral spurring at C5-6 causes mild encroachment on the right foramen. Widely patent spinal canal MRI THORACIC SPINE FINDINGS Alignment:  Normal Vertebrae: No fracture, evidence of discitis, or bone lesion. Cord: Normal signal and morphology when accounting for motion artifact. Paraspinal and other soft tissues: Negative Disc levels: No impingement or degenerative disease. MRI LUMBAR SPINE FINDINGS Segmentation:  Normal Alignment:  Normal Vertebrae:  No fracture, evidence of discitis, or bone lesion. Conus medullaris: Extends to the T12-L1 level has an unremarkable appearance on the Thoracic study. Paraspinal and other soft tissues: Negative Disc levels: L5-S1 mild disc desiccation and narrowing with shallow central protrusion not contacting any of the neural elements. There is mild facet spurring at L4-5. IMPRESSION: 1. Evaluation the cord is moderately degraded by motion artifact. 2. Suspected nonenhancing demyelinating focus at the cervicomedullary junction ventrally. 3. The remainder of the cord and the cauda equina is negative. 4. Mild degenerative changes without impingement. Electronically Signed   By: Monte Fantasia M.D.   On: 08/20/2018 07:19   Dg Fl Guided Lumbar Puncture  Result Date: 08/19/2018 CLINICAL DATA:  Multiple sclerosis. EXAM: DIAGNOSTIC LUMBAR PUNCTURE UNDER FLUOROSCOPIC GUIDANCE FLUOROSCOPY TIME:  Fluoroscopy Time:  0 minutes 48 seconds. Radiation Exposure Index (if provided by the fluoroscopic device): 10.8 mGy Number of Acquired Spot Images: 1 PROCEDURE:  Informed consent was obtained from the patient prior to the procedure, including potential complications of headache, bleeding, allergy, and pain. With the patient prone, the lower back was prepped with Betadine. 1% Lidocaine was used for local anesthesia. Lumbar puncture was performed at the L3-L4 level using a 22 gauge needle with return of clear CSF. 10 Ml of CSF were obtained for laboratory studies. The patient tolerated the procedure well and there were no apparent complications. IMPRESSION: Successful lumbar puncture with CSF sent requested labs. Electronically Signed   By: Marcello Moores  Register   On: 08/19/2018 14:26     ASSESSMENT AND PLAN:   38 year old female patient currently under hospitalist service with difficulty in speech  -Multiple sclerosis Status post neurology evaluation On high-dose IV Solu-Medrol thousand milligrams daily for 5 days Today is day 3  -Dysarthria improved Speech therapy follow-up  -Weakness in the left leg improving Physical therapy evaluation Symptoms are secondary to global white matter changes  -DVT prophylaxis subcu Lovenox daily  -GI prophylaxis with oral Protonix  All the records are reviewed and case discussed with Care Management/Social Worker. Management plans discussed with the patient, family and they are in agreement.  CODE STATUS: Full code  DVT Prophylaxis: SCDs  TOTAL TIME TAKING CARE OF THIS PATIENT: 34 minutes.   POSSIBLE D/C IN 2 to 3 DAYS, DEPENDING ON CLINICAL CONDITION.  Saundra Shelling M.D on 08/21/2018 at 11:08 AM  Between 7am to 6pm - Pager - [phone]  After 6pm go to [email or web address] - password [email or web address] [facility]  Office  [phone]  CC: Primary care physician; Volney American, PA-C  Note: This dictation was prepared with Dragon dictation along with smaller phrase technology. Any transcriptional errors that result from this process are unintentional.

## 2018-08-21 NOTE — Progress Notes (Signed)
Brief History  Desiree Chavez is an 38 y.o. female with significant past medical history C/W endometriosis presented to the hospital complaining about 3 days of slurred speech. Patient's symptoms started on Wednesday night, patient stated that her speech was slurred and she could not get her words out, she thought that it was due to being tired, when she reported just to work on Thursday morning her manager asked her if she was drunk due to 'slurring her words', patient symptom has not improved so she decided to come the emergency room to seek medical attention. Patient also reported that her left lower extremity was weak, she could not lift it up when she tried, patient denied any visual changes, she denied any fall, she denies any numbness. Patient reported that couple months ago her left lower extremity was weak for couple of days and it resolved on its own.  Patient reported no incontinence of the stool or urine. In the emergency room patient had an MRI of the brain with and without contrast which shows multiple lesions that is consistent with active demyelinating disease she was admitted to the hospital for further work-up. Pt has a positive family history of multiple sclerosis ( Aunt from mother side) .  Subjective  Feels better, speech and LLE weakness is improving   Past Medical History Past Medical History:  Diagnosis Date  . Endometriosis     Past Surgical History Past Surgical History:  Procedure Laterality Date  . LAPAROSCOPIC ENDOMETRIOSIS FULGURATION N/A     Allergies Allergies  Allergen Reactions  . Codeine Rash    Home Medications Medications Prior to Admission  Medication Sig Dispense Refill  . fexofenadine (ALLEGRA) 180 MG tablet Take 180 mg by mouth daily.    . norgestimate-ethinyl estradiol (ORTHO-CYCLEN, 28,) 0.25-35 MG-MCG tablet Take 1 tablet by mouth daily. [facility] Medications . docusate sodium  100 mg Oral BID  . enoxaparin  (LOVENOX) injection  40 mg Subcutaneous Q24H  . loratadine  10 mg Oral Daily  . pantoprazole  40 mg Oral Daily  . sodium chloride flush  3 mL Intravenous Once     Objective  Intake/Output from previous day: 02/08 0701 - 02/09 0700 In: 697.7 [P.O.:240; I.V.:395.7; IV Piggyback:62] Out: -  Intake/Output this shift: No intake/output data recorded. Nutritional status:  Diet Order            Diet Heart Room service appropriate? Yes; Fluid consistency: Thin  Diet effective now               Physical Exam -  Vitals:   08/20/18 0500 08/20/18 1559 08/20/18 2022 08/21/18 0444  BP:  105/69 124/68 108/70  Pulse:  88 75 78  Resp:   18 15  Temp:  98.2 F (36.8 C) 98.4 F (36.9 C) 98.3 F (36.8 C)  TempSrc:  Oral Oral Oral  SpO2:  100% 100% 99%  Weight: 67.9 kg   66.6 kg  Height:       General -  NAD lying in bed  Heart - Regular rate and rhythm - no murmer Lungs - Clear to auscultation Abdomen - Soft - non tender Extremities - Distal pulses intact - no edema Skin - Warm and dry  Neurologic Exam:  Mental Status:  Alert, oriented, thought content appropriate.  Speech slurred, no aphasia, normal naming  Able to follow 3 step commands without difficulty.  Cranial Nerves:  bilateral visual fields intact Pupils were equal and reacted. Extraocular  movements were full.  no facial numbness and no facial weakness.  hearing normal.  midline tongue extension  Motor: RUE 4/5 LUE 5/5 RLE 5/5 LLE 4/5 Sensory: Intact to light touch in all extremities. Deep Tendon Reflexes: HYPERREFLEXIC throughout  Cerebellar: dysmetria  Gait: not tested    LABORATORY RESULTS:  Basic Metabolic Panel: Recent Labs  Lab 08/18/18 1811 08/21/18 0323  NA 137  --   K 3.4* 3.8  CL 104  --   CO2 24  --   GLUCOSE 107*  --   BUN 10  --   CREATININE 0.62  --   CALCIUM 9.0  --     Liver Function Tests: Recent Labs  Lab 08/18/18 1811  AST 23  ALT 18  ALKPHOS 23*  BILITOT 1.4*  PROT  7.8  ALBUMIN 4.4   No results for input(s): LIPASE, AMYLASE in the last 168 hours. No results for input(s): AMMONIA in the last 168 hours.  CBC: Recent Labs  Lab 08/18/18 1811  WBC 6.3  NEUTROABS 4.2  HGB 12.5  HCT 37.7  MCV 87.7  PLT 208    Cardiac Enzymes: Recent Labs  Lab 08/18/18 1811  TROPONINI <0.03    Lipid Panel: No results for input(s): CHOL, TRIG, HDL, CHOLHDL, VLDL, LDLCALC in the last 168 hours.  CBG: No results for input(s): GLUCAP in the last 168 hours.  Microbiology:   Coagulation Studies: Recent Labs    08/18/18 1811  LABPROT 13.0  INR 0.99    Miscellaneous Labs:   IMAGING RESULTS Mr Cervical Spine W Wo Contrast  Result Date: 08/20/2018 CLINICAL DATA:  Demyelinating findings on brain MRI. EXAM: MRI TOTAL SPINE WITHOUT AND WITH CONTRAST TECHNIQUE: Multisequence MR imaging of the spine from the cervical spine to the sacrum was performed prior to and following IV contrast administration. CONTRAST:  7 cc Gadavist intravenous COMPARISON:  None. FINDINGS: MRI CERVICAL SPINE FINDINGS Alignment: Normal Vertebrae: No fracture, evidence of discitis, or bone lesion. Cord: Short-segment T2 hyperintensity in the ventral cord at the cervicomedullary junction is convincing on sagittal T2 weighted imaging and STIR. No convincing other cord signal abnormality when allowing for extensive motion artifact Posterior Fossa, vertebral arteries, paraspinal tissues: Negative Disc levels: Noncompressive central disc protrusions at C4-5 to C6-7. Bulge and uncovertebral spurring at C5-6 causes mild encroachment on the right foramen. Widely patent spinal canal MRI THORACIC SPINE FINDINGS Alignment:  Normal Vertebrae: No fracture, evidence of discitis, or bone lesion. Cord: Normal signal and morphology when accounting for motion artifact. Paraspinal and other soft tissues: Negative Disc levels: No impingement or degenerative disease. MRI LUMBAR SPINE FINDINGS Segmentation:  Normal  Alignment:  Normal Vertebrae:  No fracture, evidence of discitis, or bone lesion. Conus medullaris: Extends to the T12-L1 level has an unremarkable appearance on the Thoracic study. Paraspinal and other soft tissues: Negative Disc levels: L5-S1 mild disc desiccation and narrowing with shallow central protrusion not contacting any of the neural elements. There is mild facet spurring at L4-5. IMPRESSION: 1. Evaluation the cord is moderately degraded by motion artifact. 2. Suspected nonenhancing demyelinating focus at the cervicomedullary junction ventrally. 3. The remainder of the cord and the cauda equina is negative. 4. Mild degenerative changes without impingement. Electronically Signed   By: Monte Fantasia M.D.   On: 08/20/2018 07:19   Mr Thoracic Spine W Wo Contrast  Result Date: 08/20/2018 CLINICAL DATA:  Demyelinating findings on brain MRI. EXAM: MRI TOTAL SPINE WITHOUT AND WITH CONTRAST TECHNIQUE: Multisequence MR imaging of the  spine from the cervical spine to the sacrum was performed prior to and following IV contrast administration. CONTRAST:  7 cc Gadavist intravenous COMPARISON:  None. FINDINGS: MRI CERVICAL SPINE FINDINGS Alignment: Normal Vertebrae: No fracture, evidence of discitis, or bone lesion. Cord: Short-segment T2 hyperintensity in the ventral cord at the cervicomedullary junction is convincing on sagittal T2 weighted imaging and STIR. No convincing other cord signal abnormality when allowing for extensive motion artifact Posterior Fossa, vertebral arteries, paraspinal tissues: Negative Disc levels: Noncompressive central disc protrusions at C4-5 to C6-7. Bulge and uncovertebral spurring at C5-6 causes mild encroachment on the right foramen. Widely patent spinal canal MRI THORACIC SPINE FINDINGS Alignment:  Normal Vertebrae: No fracture, evidence of discitis, or bone lesion. Cord: Normal signal and morphology when accounting for motion artifact. Paraspinal and other soft tissues: Negative  Disc levels: No impingement or degenerative disease. MRI LUMBAR SPINE FINDINGS Segmentation:  Normal Alignment:  Normal Vertebrae:  No fracture, evidence of discitis, or bone lesion. Conus medullaris: Extends to the T12-L1 level has an unremarkable appearance on the Thoracic study. Paraspinal and other soft tissues: Negative Disc levels: L5-S1 mild disc desiccation and narrowing with shallow central protrusion not contacting any of the neural elements. There is mild facet spurring at L4-5. IMPRESSION: 1. Evaluation the cord is moderately degraded by motion artifact. 2. Suspected nonenhancing demyelinating focus at the cervicomedullary junction ventrally. 3. The remainder of the cord and the cauda equina is negative. 4. Mild degenerative changes without impingement. Electronically Signed   By: Monte Fantasia M.D.   On: 08/20/2018 07:19   Mr Lumbar Spine W Wo Contrast  Result Date: 08/20/2018 CLINICAL DATA:  Demyelinating findings on brain MRI. EXAM: MRI TOTAL SPINE WITHOUT AND WITH CONTRAST TECHNIQUE: Multisequence MR imaging of the spine from the cervical spine to the sacrum was performed prior to and following IV contrast administration. CONTRAST:  7 cc Gadavist intravenous COMPARISON:  None. FINDINGS: MRI CERVICAL SPINE FINDINGS Alignment: Normal Vertebrae: No fracture, evidence of discitis, or bone lesion. Cord: Short-segment T2 hyperintensity in the ventral cord at the cervicomedullary junction is convincing on sagittal T2 weighted imaging and STIR. No convincing other cord signal abnormality when allowing for extensive motion artifact Posterior Fossa, vertebral arteries, paraspinal tissues: Negative Disc levels: Noncompressive central disc protrusions at C4-5 to C6-7. Bulge and uncovertebral spurring at C5-6 causes mild encroachment on the right foramen. Widely patent spinal canal MRI THORACIC SPINE FINDINGS Alignment:  Normal Vertebrae: No fracture, evidence of discitis, or bone lesion. Cord: Normal signal  and morphology when accounting for motion artifact. Paraspinal and other soft tissues: Negative Disc levels: No impingement or degenerative disease. MRI LUMBAR SPINE FINDINGS Segmentation:  Normal Alignment:  Normal Vertebrae:  No fracture, evidence of discitis, or bone lesion. Conus medullaris: Extends to the T12-L1 level has an unremarkable appearance on the Thoracic study. Paraspinal and other soft tissues: Negative Disc levels: L5-S1 mild disc desiccation and narrowing with shallow central protrusion not contacting any of the neural elements. There is mild facet spurring at L4-5. IMPRESSION: 1. Evaluation the cord is moderately degraded by motion artifact. 2. Suspected nonenhancing demyelinating focus at the cervicomedullary junction ventrally. 3. The remainder of the cord and the cauda equina is negative. 4. Mild degenerative changes without impingement. Electronically Signed   By: Monte Fantasia M.D.   On: 08/20/2018 07:19   Dg Fl Guided Lumbar Puncture  Result Date: 08/19/2018 CLINICAL DATA:  Multiple sclerosis. EXAM: DIAGNOSTIC LUMBAR PUNCTURE UNDER FLUOROSCOPIC GUIDANCE FLUOROSCOPY TIME:  Fluoroscopy Time:  0 minutes 48 seconds. Radiation Exposure Index (if provided by the fluoroscopic device): 10.8 mGy Number of Acquired Spot Images: 1 PROCEDURE: Informed consent was obtained from the patient prior to the procedure, including potential complications of headache, bleeding, allergy, and pain. With the patient prone, the lower back was prepped with Betadine. 1% Lidocaine was used for local anesthesia. Lumbar puncture was performed at the L3-L4 level using a 22 gauge needle with return of clear CSF. 10 Ml of CSF were obtained for laboratory studies. The patient tolerated the procedure well and there were no apparent complications. IMPRESSION: Successful lumbar puncture with CSF sent requested labs. 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  -CSF: Protein 38, WBC 4, RBC 734,  oligoclonal IgG pending , JC virus pending -MRI brain C/T/L spine reviewed  - Continue high-dose steroids for 5 days, on methylprednisone thousand milligrams IV 3/5  -please check ANA/HIV/ESR -Out patient Neurology clinic follow up to start long term MS medication  -PT/OT and speech evaluation -DVT prophylaxis -Plan discussed in details with the patient at bedside.    Arnaldo Natal, MD

## 2018-08-21 NOTE — Progress Notes (Signed)
Advanced care plan. Purpose of the Encounter: CODE STATUS Parties in Attendance: Patient Patient's Decision Capacity: Good Subjective/Patient's story: Presented to the emergency room with slurred speech and weakness in the left leg Objective/Medical story Patient needs evaluation and work-up for stroke, multiple sclerosis with MRI brain Needs neurology evaluation Goals of care determination:  Advance care directives goals of care and treatment plan discussed For now patient wants everything done which includes CPR, intubation ventilator the need arises CODE STATUS: Full code Time spent discussing advanced care planning: 16 minutes

## 2018-08-22 DIAGNOSIS — G35 Multiple sclerosis: Secondary | ICD-10-CM

## 2018-08-22 DIAGNOSIS — R4781 Slurred speech: Secondary | ICD-10-CM | POA: Diagnosis not present

## 2018-08-22 DIAGNOSIS — E876 Hypokalemia: Secondary | ICD-10-CM | POA: Diagnosis not present

## 2018-08-22 DIAGNOSIS — R531 Weakness: Secondary | ICD-10-CM | POA: Diagnosis not present

## 2018-08-22 LAB — BASIC METABOLIC PANEL
Anion gap: 5 (ref 5–15)
BUN: 22 mg/dL — ABNORMAL HIGH (ref 6–20)
CO2: 28 mmol/L (ref 22–32)
Calcium: 8.9 mg/dL (ref 8.9–10.3)
Chloride: 106 mmol/L (ref 98–111)
Creatinine, Ser: 0.64 mg/dL (ref 0.44–1.00)
GFR calc non Af Amer: >60 mL/min (ref >60)
Glucose, Bld: 164 mg/dL — ABNORMAL HIGH (ref 70–99)
Potassium: 3.8 mmol/L (ref 3.5–5.1)
SODIUM: 139 mmol/L (ref 135–145)

## 2018-08-22 LAB — SEDIMENTATION RATE: Sed Rate: 8 mm/hr (ref 0–20)

## 2018-08-22 LAB — CYTOLOGY - NON PAP

## 2018-08-22 NOTE — Progress Notes (Addendum)
Subjective: Patient up in the chair this morning. Report some improvement in her left leg. Pending PT/OT evaluation.   Objective: Current vital signs: BP (!) 104/59 (BP Location: Right Arm)   Pulse 71   Temp 97.9 F (36.6 C) (Oral)   Resp 18   Ht '5\' 6"'  (1.676 m)   Wt 67.8 kg   LMP 08/17/2018 Comment: negative preg test yesterday  SpO2 99%   BMI 24.13 kg/m  Vital signs in last 24 hours: Temp:  [97.7 F (36.5 C)-98.1 F (36.7 C)] 97.9 F (36.6 C) (02/10 0555) Pulse Rate:  [65-71] 71 (02/10 0555) Resp:  [18-20] 18 (02/10 0555) BP: (104-127)/(59-70) 104/59 (02/10 0555) SpO2:  [98 %-99 %] 99 % (02/10 0555) Weight:  [67.8 kg] 67.8 kg (02/10 0555)  Intake/Output from previous day: 02/09 0701 - 02/10 0700 In: 240 [P.O.:240] Out: -  Intake/Output this shift: Total I/O In: 240 [P.O.:240] Out: -  Nutritional status:  Diet Order            Diet Heart Room service appropriate? Yes; Fluid consistency: Thin  Diet effective now             Neurological Exam   Mental Status: Alert, oriented, thought content appropriate.  Speech fluent without evidence of aphasia.  Able to follow 3 step commands without difficulty. Attention span and concentration seemed appropriate  Cranial Nerves: II: Discs flat bilaterally; Visual fields grossly normal, pupils equal, round, reactive to light and accommodation III,IV, VI: ptosis not present, extra-ocular motions intact bilaterally V,VII: smile symmetric, facial light touch sensation intact VIII: hearing normal bilaterally IX,X: gag reflex present XI: bilateral shoulder shrug XII: midline tongue extension Motor: Right :  Upper extremity   5/5 Without pronator drift      Left: Upper extremity   4+5 without pronator drift Right:   Lower extremity   5/5                                          Left: Lower extremity   4+/5 Tone and bulk:normal tone throughout; no atrophy noted Sensory: Pinprick and light touch decreased in bilateral lower  extremities Deep Tendon Reflexes: 3+ and symmetric throughout Plantars: Right: mute                              Left: mute Cerebellar: Finger-to-nose testing dysmetric bilaterally. Heel to shin testing normal bilaterally Gait: not tested due to safety concerns  Data Reviewed  Lab Results: Basic Metabolic Panel: Recent Labs  Lab 08/18/18 1811 08/21/18 0323 08/22/18 0318  NA 137  --  139  K 3.4* 3.8 3.8  CL 104  --  106  CO2 24  --  28  GLUCOSE 107*  --  164*  BUN 10  --  22*  CREATININE 0.62  --  0.64  CALCIUM 9.0  --  8.9    Liver Function Tests: Recent Labs  Lab 08/18/18 1811  AST 23  ALT 18  ALKPHOS 23*  BILITOT 1.4*  PROT 7.8  ALBUMIN 4.4   No results for input(s): LIPASE, AMYLASE in the last 168 hours. No results for input(s): AMMONIA in the last 168 hours.  CBC: Recent Labs  Lab 08/18/18 1811  WBC 6.3  NEUTROABS 4.2  HGB 12.5  HCT 37.7  MCV 87.7  PLT 208  Cardiac Enzymes: Recent Labs  Lab 08/18/18 1811  TROPONINI <0.03    Lipid Panel: No results for input(s): CHOL, TRIG, HDL, CHOLHDL, VLDL, LDLCALC in the last 168 hours.  CBG: No results for input(s): GLUCAP in the last 168 hours.  Microbiology: Results for orders placed or performed during the hospital encounter of 08/18/18  Gram stain     Status: None   Collection Time: 08/19/18  2:04 PM  Result Value Ref Range Status   Specimen Description CSF  Final   Special Requests TUBE 2  Final   Gram Stain   Final    WBC SEEN NO ORGANISMS SEEN Performed at [facility], [address], [place], [place] [address]    Report Status 08/19/2018 FINAL  Final    Coagulation Studies: No results for input(s): LABPROT, INR in the last 72 hours.  Imaging: No results found.  Medications:  I have reviewed the patient's current medications. Prior to Admission:  Medications Prior to Admission  Medication Sig Dispense Refill Last Dose  . fexofenadine (ALLEGRA) 180 MG tablet Take  180 mg by mouth daily.   08/18/2018 at Unknown time  . norgestimate-ethinyl estradiol (ORTHO-CYCLEN, 28,) 0.25-35 MG-MCG tablet Take 1 tablet by mouth daily. 1 Package 11 Past Week at Unknown time   Scheduled: . docusate sodium  100 mg Oral BID  . enoxaparin (LOVENOX) injection  40 mg Subcutaneous Q24H  . loratadine  10 mg Oral Daily  . pantoprazole  40 mg Oral Daily  . sodium chloride flush  3 mL Intravenous Once    Assessment: 38 y.o female with past medical history of migraine headachespresenting with complaints of slurred speech, word finding difficulty and left leg weakness. She was evaluated by her Neurologist on 03/2018 for bilateral lower extremities weakness and was supposed to follow up after imaging but has not had the time to follow up. MRI brain, cervical, thoracic and lumbar spine done 08/18/2018 showed Extensive abnormal cerebral white matter changes involving the supratentorial and infratentorial cerebral white matter as above,most suspicious for demyelinating disease. Focal cord signal abnormality within the right hemi cord of theupper cervical spine, also likely related to demyelinating disease. She was started on high dose steroids due to concerns of active demyelinating disease with some improvement noted. Concerns for possible PPM given family hx of MS in aunt, however cannot rule out CADASIL  Plan:  1.Continue high-dose steroids for 5 days, on methylprednisone 1g IV 4/5 2.CSF: Protein 38, WBC 4, RBC 734, oligoclonal IgG pending ,JC virus pending 3.Labs: check HIV, RPR, ESR, ANA 4. PT/OT consult 5. Out patient Neurology clinic follow up for possible disease modifying treatment   This patient was staffed with Dr. Irish Elders, Alease Frame who personally evaluated patient, reviewed documentation and agreed with assessment and plan of care as above.  Rufina Falco, DNP, FNP-BC Board certified Nurse Practitioner Neurology Department    LOS: 2 days   08/22/2018  10:42 AM

## 2018-08-22 NOTE — Evaluation (Signed)
Physical Therapy Evaluation Patient Details Name: Desiree Chavez MRN: [id number] DOB: 1981/03/20 Today's Date: 08/22/2018   History of Present Illness  Per admitting H&P: The patient with past medical history of endometriosis presented to the emergency department complaining of difficulty speaking.  The patient called her boss to report that she may be late due to inclement weather when her supervisor inquired as to whether the patient may be drunk.  The patient denied drinking alcohol or use of narcotic medication but admitted that she is aware that her word finding ability and fluencey of speech is impaired.  The patient denied numbness in any of her extremities but admits to vague weakness of her left lower extremity.  CT of the brain was obtained which showed atrophic changes as well as white matter ischemic changes not appropriate for age which prompted the emergency department staff to call the hospitalist service for admission.  Assessment includes: New diagnosis of possible MS, dysarthria, and weakness in the left leg.    Clinical Impression  Pt presents with mild deficits in LLE strength, transfers, gait, balance, and activity tolerance.  Pt pleasant and motivated to participate with PT services.  Pt was Ind with bed mobility tasks with good speed and effort.  Pt required SBA during transfers secondary to min instability upon initial stand that pt was able to self-correct.  The pt was able to amb 200+ feet with a combination of no AD, a SPC, and lasty a RW.  Pt was mildly unsteady ambulating without an AD with occasional minor scissoring, drifting left/right, and shuffling the L foot during swing through.  Pt training provided on amb with a SPC including advancing the RUE with the cane for the pt while walking but when the pt attempted to amb with the [facility] without assistance her gait quality immediately declined with poor reciprocal stepping, choppy steps, and poor sequencing with the UEs making  pt less steady than without an AD.  Pt then ambulated with a RW with a significant improvement in cadence, stability, and clearance during LLE swing through.  Pt will benefit from OPPT services upon discharge to safely address above deficits for decreased caregiver assistance, decreased risk of further functional decline, and return toward PLOF.       Follow Up Recommendations Outpatient PT;Supervision for mobility/OOB    Equipment Recommendations  Rolling walker with 5" wheels    Recommendations for Other Services       Precautions / Restrictions Precautions Precautions: Fall Restrictions Weight Bearing Restrictions: No      Mobility  Bed Mobility Overal bed mobility: Independent                Transfers Overall transfer level: Needs assistance Equipment used: None Transfers: Sit to/from Stand Sit to Stand: Supervision         General transfer comment: Min instability upon initial stand but pt able to self-correct without assistance  Ambulation/Gait Ambulation/Gait assistance: Min guard Gait Distance (Feet): 200 Feet Assistive device: Rolling walker (2 wheeled);None;Straight cane Gait Pattern/deviations: Step-through pattern;Decreased step length - right;Decreased step length - left;Drifts right/left;Shuffle;Scissoring Gait velocity: decreased   General Gait Details: See CI for details  Stairs Stairs: Yes Stairs assistance: Supervision Stair Management: One rail Left Number of Stairs: 4 General stair comments: Mod verbal cues for sequencing with good control and stability   Wheelchair Mobility    Modified Rankin (Stroke Patients Only)       Balance Overall balance assessment: Needs assistance   Sitting balance-Leahy Scale:  Normal     Standing balance support: No upper extremity supported Standing balance-Leahy Scale: Fair Standing balance comment: (-) Romberg sign Single Leg Stance - Right Leg: 10 Single Leg Stance - Left Leg: 3                          Pertinent Vitals/Pain Pain Assessment: No/denies pain    Home Living Family/patient expects to be discharged to:: Private residence Living Arrangements: Parent;Children Available Help at Discharge: Available 24 hours/day;Family Type of Home: House Home Access: Stairs to enter Entrance Stairs-Rails: Psychiatric nurse of Steps: 4 Home Layout: One level Home Equipment: Cane - single point;Crutches      Prior Function Level of Independence: Independent         Comments: Ind amb community distances without an AD, 2 falls in the last 6 months, works FT, Ind with ADLs     Hand Dominance   Dominant Hand: Right    Extremity/Trunk Assessment        Lower Extremity Assessment Lower Extremity Assessment: LLE deficits/detail LLE Deficits / Details: LLE strength grossly 4/5 except for knee flex 3+/5 LLE Sensation: WNL       Communication   Communication: No difficulties  Cognition Arousal/Alertness: Awake/alert Behavior During Therapy: WFL for tasks assessed/performed Overall Cognitive Status: Within Functional Limits for tasks assessed                                        General Comments      Exercises Other Exercises Other Exercises: Static standing balance training with feet apart, together, and semi-tandem with combinations of eyes open and closed Other Exercises: Pt education provided on gradual activity progression with avoiding overheating as long as MS remains a possible diagnosis    Assessment/Plan    PT Assessment Patient needs continued PT services  PT Problem List Decreased strength;Decreased balance;Decreased knowledge of use of DME       PT Treatment Interventions DME instruction;Gait training;Stair training;Functional mobility training;Therapeutic activities;Therapeutic exercise;Balance training;Patient/family education    PT Goals (Current goals can be found in the Care Plan section)   Acute Rehab PT Goals Patient Stated Goal: Improved balance and strength PT Goal Formulation: With patient Time For Goal Achievement: 09/04/18 Potential to Achieve Goals: Good    Frequency Min 2X/week   Barriers to discharge        Co-evaluation               AM-PAC PT "6 Clicks" Mobility  Outcome Measure Help needed turning from your back to your side while in a flat bed without using bedrails?: None Help needed moving from lying on your back to sitting on the side of a flat bed without using bedrails?: None Help needed moving to and from a bed to a chair (including a wheelchair)?: A Little Help needed standing up from a chair using your arms (e.g., wheelchair or bedside chair)?: A Little Help needed to walk in hospital room?: A Little Help needed climbing 3-5 steps with a railing? : A Little 6 Click Score: 20    End of Session Equipment Utilized During Treatment: Gait belt Activity Tolerance: Patient tolerated treatment well Patient left: in bed;with family/visitor present;with call bell/phone within reach Nurse Communication: Mobility status PT Visit Diagnosis: Unsteadiness on feet (R26.81);Difficulty in walking, not elsewhere classified (R26.2);Other symptoms and signs involving the nervous system (  R29.898)    Time: 3013-1438 PT Time Calculation (min) (ACUTE ONLY): 40 min   Charges:   PT Evaluation $PT Eval Moderate Complexity: 1 Mod PT Treatments $Gait Training: 8-22 mins        D. Scott Alesi Zachery PT, DPT 08/22/18, 1:44 PM

## 2018-08-22 NOTE — Plan of Care (Signed)

## 2018-08-22 NOTE — Progress Notes (Signed)
   08/22/18 1600  Clinical Encounter Type  Visited With Patient and family together  Visit Type Follow-up;Psychological support;Spiritual support  Spiritual Encounters  Spiritual Needs Prayer;Emotional  Stress Factors  Patient Stress Factors Health changes;Major life changes  Family Stress Factors Major life changes  Ch was rounding. Pt shares concerns about the major life changes that her new diagnosis might bring in the future. She said the doctor suspects her to be having MS, which her aunt also has. Pt's parents came in a little later. Family is very supportive but tries to repress any expressions of worry or sadness. Pt became emotional a few times but was calmed down by her parents. Ch observed that the pt and family use humor to cope with any negative thoughts or emotions. Pt might benefit from being able to let her feelings out but it would be difficult with her family members present.

## 2018-08-22 NOTE — Progress Notes (Signed)
[facility] NAME: Kileigh Ortmann    MR#:  [id number]  DATE OF BIRTH:  17-Nov-1980  SUBJECTIVE:  CHIEF COMPLAINT:   Chief Complaint  Patient presents with  . Aphasia    prior to going to bed last night   Patient seen today Speech appears to be better Weakness in the left leg has improved Tolerating diet okay No blurry vision No chest pain Ok apetite  REVIEW OF SYSTEMS:    ROS  CONSTITUTIONAL: No documented fever. No fatigue, weakness. No weight gain, no weight loss.  EYES: No blurry or double vision.  ENT: No tinnitus. No postnasal drip. No redness of the oropharynx.  RESPIRATORY: No cough, no wheeze, no hemoptysis. No dyspnea.  CARDIOVASCULAR: No chest pain. No orthopnea. No palpitations. No syncope.  GASTROINTESTINAL: No nausea, no vomiting or diarrhea. No abdominal pain. No melena or hematochezia.  GENITOURINARY: No dysuria or hematuria.  ENDOCRINE: No polyuria or nocturia. No heat or cold intolerance.  HEMATOLOGY: No anemia. No bruising. No bleeding.  INTEGUMENTARY: No rashes. No lesions.  MUSCULOSKELETAL: No arthritis. No swelling. No gout.  NEUROLOGIC: No numbness, tingling, or ataxia. No seizure-type activity.  Slurred speech improved PSYCHIATRIC: No anxiety. No insomnia. No ADD.   DRUG ALLERGIES:   Allergies  Allergen Reactions  . Codeine Rash    VITALS:  Blood pressure (!) 104/59, pulse 71, temperature 97.9 F (36.6 C), temperature source Oral, resp. rate 18, height 5\' 6"  (1.676 m), weight 67.8 kg, last menstrual period 08/17/2018, SpO2 99 %.  PHYSICAL EXAMINATION:   Physical Exam  GENERAL:  38 y.o.-year-old patient lying in the bed with no acute distress.  EYES: Pupils equal, round, reactive to light and accommodation. No scleral icterus. Extraocular muscles intact.  HEENT: Head atraumatic, normocephalic. Oropharynx and nasopharynx clear.  NECK:  Supple, no jugular venous distention. No thyroid  enlargement, no tenderness.  LUNGS: Normal breath sounds bilaterally, no wheezing, rales, rhonchi. No use of accessory muscles of respiration.  CARDIOVASCULAR: S1, S2 normal. No murmurs, rubs, or gallops.  ABDOMEN: Soft, nontender, nondistended. Bowel sounds present. No organomegaly or mass.  EXTREMITIES: No cyanosis, clubbing or edema b/l.    NEUROLOGIC: Dysarthria improved. No focal Motor or sensory deficits b/l.   PSYCHIATRIC: The patient is alert and oriented x 3.  SKIN: No obvious rash, lesion, or ulcer.   LABORATORY PANEL:   CBC Recent Labs  Lab 08/18/18 1811  WBC 6.3  HGB 12.5  HCT 37.7  PLT 208   ------------------------------------------------------------------------------------------------------------------ Chemistries  Recent Labs  Lab 08/18/18 1811  08/22/18 0318  NA 137  --  139  K 3.4*   < > 3.8  CL 104  --  106  CO2 24  --  28  GLUCOSE 107*  --  164*  BUN 10  --  22*  CREATININE 0.62  --  0.64  CALCIUM 9.0  --  8.9  AST 23  --   --   ALT 18  --   --   ALKPHOS 23*  --   --   BILITOT 1.4*  --   --    < > = values in this interval not displayed.   ------------------------------------------------------------------------------------------------------------------  Cardiac Enzymes Recent Labs  Lab 08/18/18 1811  TROPONINI <0.03   ------------------------------------------------------------------------------------------------------------------  RADIOLOGY:  No results found.   ASSESSMENT AND PLAN:   38 year old female patient currently under hospitalist service with difficulty in speech  -Multiple sclerosis Status post neurology evaluation On  high-dose IV Solu-Medrol thousand milligrams daily for 5 days Today is day 4 Appreciate neurology f/u  -Dysarthria improved Speech therapy follow-up  -Weakness in the left leg improving Physical therapy evaluation today Symptoms are secondary to global white matter changes  -DVT prophylaxis subcu  Lovenox daily  -GI prophylaxis with oral Protonix  All the records are reviewed and case discussed with Care Management/Social Worker. Management plans discussed with the patient, family and they are in agreement.  CODE STATUS: Full code  DVT Prophylaxis: SCDs  TOTAL TIME TAKING CARE OF THIS PATIENT: 35 minutes.   POSSIBLE D/C IN 2 to 3 DAYS, DEPENDING ON CLINICAL CONDITION.  Saundra Shelling M.D on 08/22/2018 at 11:00 AM  Between 7am to 6pm - Pager - [phone]  After 6pm go to [email or web address] - password [email or web address] [facility]  Office  [phone]  CC: Primary care physician; Volney American, PA-C  Note: This dictation was prepared with Dragon dictation along with smaller phrase technology. Any transcriptional errors that result from this process are unintentional.

## 2018-08-23 DIAGNOSIS — G35 Multiple sclerosis: Secondary | ICD-10-CM | POA: Diagnosis not present

## 2018-08-23 DIAGNOSIS — R4781 Slurred speech: Secondary | ICD-10-CM | POA: Diagnosis not present

## 2018-08-23 DIAGNOSIS — R531 Weakness: Secondary | ICD-10-CM | POA: Diagnosis not present

## 2018-08-23 DIAGNOSIS — E876 Hypokalemia: Secondary | ICD-10-CM | POA: Diagnosis not present

## 2018-08-23 LAB — HIV ANTIBODY (ROUTINE TESTING W REFLEX): HIV Screen 4th Generation wRfx: NONREACTIVE

## 2018-08-23 LAB — RPR: RPR Ser Ql: NONREACTIVE

## 2018-08-23 LAB — OLIGOCLONAL BANDS, CSF + SERM

## 2018-08-23 LAB — ANA: Anti Nuclear Antibody(ANA): NEGATIVE

## 2018-08-23 NOTE — Discharge Summary (Addendum)
[facility] [place] NAME: Desiree Chavez    MR#:  [id number]  DATE OF BIRTH:  [birth year]  DATE OF ADMISSION:  08/18/2018 ADMITTING PHYSICIAN: Harrie Foreman, MD  DATE OF DISCHARGE: 08/23/2018  PRIMARY CARE PHYSICIAN: Volney American, PA-C   ADMISSION DIAGNOSIS:  Multiple sclerosis ([place]) [G35] Slurred speech [R47.81] Left foot drop [M21.372] Left leg weakness [R29.898] Expressive aphasia [R47.01]  DISCHARGE DIAGNOSIS:  Left lower extremity weakness Expressive aphasia Multiple sclerosis  SECONDARY DIAGNOSIS:   Past Medical History:  Diagnosis Date  . Endometriosis      ADMITTING HISTORY The patient with past medical history of endometriosis presents to the emergency department complaining of difficulty speaking.  The patient called her boss this morning to report that she may be late due to inclement weather when her supervisor inquired as to whether the patient may be drunk.  The patient denies drinking alcohol or use of narcotic medication but admits that she is aware that her word finding ability and fluencey of speech is impaired.  The patient denies numbness in any of her extremities but admits to vague weakness of her left lower extremity.  CT of the brain was obtained which showed atrophic changes as well as white matter ischemic changes not appropriate for age which prompted the emergency department staff to call the hospitalist service for admission.  HOSPITAL COURSE:  Patient was admitted to medical floor.  Neurology consultation was done.  Patient was worked up with MRI thoracic spine, lumbar spine, cervical spine and MRI brain.  MRI brain revealed extensive abnormal cerebral white matter changes involving the supratentorial infratentorial cerebral white matter changes suspicious for demyelinating disease.  Neurology said patient has probably multiple sclerosis.  She was started on IV Solu-Medrol high-dose thousand  milligrams daily for 5 days.  Had lumbar puncture done and CSF analysis was reviewed.  RPR and HIV screen was negative .patient tolerated the medication well.  Speech therapy evaluated slurred speech.  Her expressive aphasia also improved.  She was also evaluated by physical therapy during the hospitalization for left leg weakness.  Her left lower extremity weakness also improved after high-dose Solu-Medrol.  Patient will be discharged home with outpatient physical therapy.  Follow-up with neurology in the clinic.  CONSULTS OBTAINED:  Treatment Team:  Catarina Hartshorn, MD  DRUG ALLERGIES:   Allergies  Allergen Reactions  . Codeine Rash    DISCHARGE MEDICATIONS:   Allergies as of 08/23/2018      Reactions   Codeine Rash      Medication List    STOP taking these medications   fexofenadine 180 MG tablet Commonly known as:  ALLEGRA   norgestimate-ethinyl estradiol 0.25-35 MG-MCG tablet Commonly known as:  ORTHO-CYCLEN (28)            Durable Medical Equipment  (From admission, onward)         Start     Ordered   08/23/18 0858  For home use only DME Walker rolling  Once    Question:  Patient needs a walker to treat with the following condition  Answer:  Gait instability   08/23/18 0857          Today  Patient seen today No chest pain No shortness of breath No headache and dizziness Hemodynamically stable  VITAL SIGNS:  Blood pressure 100/63, pulse 69, temperature 98.2 F (36.8 C), temperature source Oral, resp. rate 18, height 5\' 6"  (1.676 m), weight 67.4 kg, last menstrual  period 08/17/2018, SpO2 96 %.  I/O:    Intake/Output Summary (Last 24 hours) at 08/23/2018 1111 Last data filed at 08/22/2018 1902 Gross per 24 hour  Intake 290 ml  Output -  Net 290 ml    PHYSICAL EXAMINATION:  Physical Exam  GENERAL:  38 y.o.-year-old patient lying in the bed with no acute distress.  LUNGS: Normal breath sounds bilaterally, no wheezing, rales,rhonchi or  crepitation. No use of accessory muscles of respiration.  CARDIOVASCULAR: S1, S2 normal. No murmurs, rubs, or gallops.  ABDOMEN: Soft, non-tender, non-distended. Bowel sounds present. No organomegaly or mass.  NEUROLOGIC: Moves all 4 extremities. PSYCHIATRIC: The patient is alert and oriented x 3.  SKIN: No obvious rash, lesion, or ulcer.   DATA REVIEW:   CBC Recent Labs  Lab 08/18/18 1811  WBC 6.3  HGB 12.5  HCT 37.7  PLT 208    Chemistries  Recent Labs  Lab 08/18/18 1811  08/22/18 0318  NA 137  --  139  K 3.4*   < > 3.8  CL 104  --  106  CO2 24  --  28  GLUCOSE 107*  --  164*  BUN 10  --  22*  CREATININE 0.62  --  0.64  CALCIUM 9.0  --  8.9  AST 23  --   --   ALT 18  --   --   ALKPHOS 23*  --   --   BILITOT 1.4*  --   --    < > = values in this interval not displayed.    Cardiac Enzymes Recent Labs  Lab 08/18/18 1811  TROPONINI <0.03    Microbiology Results  Results for orders placed or performed during the hospital encounter of 08/18/18  Gram stain     Status: None   Collection Time: 08/19/18  2:04 PM  Result Value Ref Range Status   Specimen Description CSF  Final   Special Requests TUBE 2  Final   Gram Stain   Final    WBC SEEN NO ORGANISMS SEEN Performed at [facility], [address], [place], [place] [address]    Report Status 08/19/2018 FINAL  Final    RADIOLOGY:  No results found.  Follow up with PCP in 1 week.  Management plans discussed with the patient, family and they are in agreement.  CODE STATUS: Full code    Code Status Orders  (From admission, onward)         Start     Ordered   08/19/18 1352  Full code  Continuous     08/19/18 1351        Code Status History    This patient has a current code status but no historical code status.      TOTAL TIME TAKING CARE OF THIS PATIENT ON DAY OF DISCHARGE: more than 35 minutes.   Saundra Shelling M.D on 08/23/2018 at 11:11 AM  Between 7am to 6pm - Pager -  [phone]  After 6pm go to [email or web address] - password [email or web address] [facility]  Office  [phone]  CC: Primary care physician; Volney American, PA-C  Note: This dictation was prepared with Dragon dictation along with smaller phrase technology. Any transcriptional errors that result from this process are unintentional.

## 2018-08-23 NOTE — Care Management Note (Signed)
Case Management Note  Patient Details  Name: Desiree Chavez MRN: [id number] Date of Birth: 12-13-1980  Subjective/Objective:                    Action/Plan: Met with patient to discuss DC plans and needs She lives with her 40 year old son, her mother and father, her father is retired and will provide assistance and transportation to all appointments She is getting a walker from a friend and states she does not need me to get her one She is setting up Outpatient PT on her own,  She has appointments for her neurologist that she saw previously.  I gave her my contact information in case she has a need that arises before going home that we did not discuss.   Expected Discharge Date:  08/23/18               Expected Discharge Plan:     In-House Referral:     Discharge planning Services  CM Consult  Post Acute Care Choice:    Choice offered to:     DME Arranged:    DME Agency:     HH Arranged:    HH Agency:     Status of Service:  Completed, signed off  If discussed at [employer] of Stay Meetings, dates discussed:    Additional Comments:  Su Hilt, RN 08/23/2018, 10:27 AM

## 2018-08-23 NOTE — Progress Notes (Signed)
Pt d/c to home with family. IVs removed intact. VSS. Education complete. All questions answered. All belongings sent with pt.

## 2018-08-24 ENCOUNTER — Telehealth: Payer: Self-pay

## 2018-08-24 DIAGNOSIS — E538 Deficiency of other specified B group vitamins: Secondary | ICD-10-CM | POA: Diagnosis not present

## 2018-08-24 DIAGNOSIS — G35 Multiple sclerosis: Secondary | ICD-10-CM | POA: Diagnosis not present

## 2018-08-24 NOTE — Telephone Encounter (Signed)
Transition Care Management Follow-up Telephone Call  Date of discharge and from where: 08/23/2018  How have you been since you were released from the hospital? "Better now that I am home"  Any questions or concerns? Yes , was taken off birth control in the hospital and wants to discuss with Apolonio Schneiders about being put back on it. She was taking it for her endometriosis.   Items Reviewed:  Did the pt receive and understand the discharge instructions provided? Yes   Medications obtained and verified? Yes   Any new allergies since your discharge? No   Dietary orders reviewed? Yes  Do you have support at home? Yes   Functional Questionnaire: (I = Independent and D = Dependent) ADLs:   Bathing/Dressing- I, using grab bars in bathroom   Meal Prep- I  Eating- I  Maintaining continence- I  Transferring/Ambulation- D, using walker if outside  Managing Meds- D  Follow up appointments reviewed:   PCP Hospital f/u appt confirmed? Yes  Scheduled to see Merrie Roof, [facility] on 08/30/2018 @ 9:30am   Specialist Hospital f/u appt confirmed? Yes  Scheduled to see Dr.Potter on today   Are transportation arrangements needed? No   If their condition worsens, is the pt aware to call PCP or go to the Emergency Dept.? Yes  Was the patient provided with contact information for the PCP's office or ED? Yes  Was to pt encouraged to call back with questions or concerns? Yes

## 2018-08-26 LAB — JC VIRUS, PCR CSF: JC Virus PCR, CSF: NEGATIVE

## 2018-08-26 LAB — JC VIRUS DNA,PCR (WHOLE BLOOD): JC Virus DNA, PCR, Blood: NEGATIVE

## 2018-08-30 ENCOUNTER — Ambulatory Visit (INDEPENDENT_AMBULATORY_CARE_PROVIDER_SITE_OTHER): Payer: BLUE CROSS/BLUE SHIELD | Admitting: Family Medicine

## 2018-08-30 ENCOUNTER — Encounter: Payer: Self-pay | Admitting: Family Medicine

## 2018-08-30 VITALS — BP 86/59 | HR 110 | Temp 97.8°F | Ht 66.0 in | Wt 146.9 lb

## 2018-08-30 DIAGNOSIS — G35 Multiple sclerosis: Secondary | ICD-10-CM | POA: Diagnosis not present

## 2018-08-30 DIAGNOSIS — N809 Endometriosis, unspecified: Secondary | ICD-10-CM | POA: Diagnosis not present

## 2018-08-30 MED ORDER — FLUTICASONE PROPIONATE 50 MCG/ACT NA SUSP: 2.0000 | Freq: Two times a day (BID) | NASAL | 0 refills | Status: DC

## 2018-08-30 NOTE — Patient Instructions (Signed)
Debrox drops

## 2018-08-30 NOTE — Progress Notes (Signed)
BP (!) 86/59 (BP Location: Left Arm, Patient Position: Sitting, Cuff Size: Normal)   Pulse (!) 110   Temp 97.8 F (36.6 C) (Oral)   Ht 5\' 6"  (1.676 m)   Wt 146 lb 14.4 oz (66.6 kg)   LMP 08/17/2018   SpO2 100%   BMI 23.71 kg/m    Subjective:    Patient ID: Desiree Chavez, Chavez    DOB: 1981-01-17, 38 y.o.   MRN: [id number]  HPI: Desiree Chavez is a 38 y.o. Chavez  Chief Complaint  Patient presents with  . Hospitalization Follow-up    Was taken off all medications in hospital because of MS. Neurologist put patient on nortiptyline for headaches.   Here today for hospital f/u for aphasia. Was diagnosed via MRIs showing extensive white mater changes and brain atrophy. with MS. Following with Neurology and awaiting second opinion per request of Neurologist. [facility] March 16th, Dr. Melrose Nakayama March 18th. States sxs have resolved since d/c but still feeling fatigued and trying to process this new diagnosis.   Main concern today is her significantly painful periods since being taken off birth control when all this started. Known hx of endometriosis. Does not currently have a GYN in the area. Painful, prolonged periods currently.   Transition of Care Hospital Follow up.   Hospital/Facility: [facility] D/C Physician:  Dr. Leroy Libman D/C Date: 08/23/18  Records Requested: available in EMR Records Received: immediately Records Reviewed: 08/30/18  Diagnoses on Discharge: MS, expressive aphasia, LLE weakness  Date of interactive Contact within 48 hours of discharge: 08/24/18 Contact was through: phone  Date of 7 day or 14 day face-to-face visit:    within 7 days  Outpatient Encounter Medications as of 08/30/2018  Medication Sig  . nortriptyline (PAMELOR) 10 MG capsule Start Nortriptyline (Pamelor) 10 mg nightly for one week, then increase to 20 mg nightly  . fluticasone (FLONASE) 50 MCG/ACT nasal spray Place 2 sprays into both nostrils 2 (two) times daily.   No  facility-administered encounter medications on file as of 08/30/2018.     Diagnostic Tests Reviewed/Disposition: MRI brain, lumbar spine  Consults: Neurology  Discharge Instructions: F/u with Neurology  Disease/illness Education: given by Neurology  Home Health/Community Services Discussions/Referrals: n/a  Establishment or re-establishment of referral orders for community resources: n/a  Discussion with other health care providers: n/a  Assessment and Support of treatment regimen adherence: excellent  Appointments Coordinated with: Neurology  Education for self-management, independent living, and ADLs: done  Relevant past medical, surgical, family and social history reviewed and updated as indicated. Interim medical history since our last visit reviewed. Allergies and medications reviewed and updated.  Review of Systems  Per HPI unless specifically indicated above     Objective:    BP (!) 86/59 (BP Location: Left Arm, Patient Position: Sitting, Cuff Size: Normal)   Pulse (!) 110   Temp 97.8 F (36.6 C) (Oral)   Ht 5\' 6"  (8.295 m)   Wt 146 lb 14.4 oz (66.6 kg)   LMP 08/17/2018   SpO2 100%   BMI 23.71 kg/m   Wt Readings from Last 3 Encounters:  08/30/18 146 lb 14.4 oz (66.6 kg)  08/23/18 148 lb 9.4 oz (67.4 kg)  08/17/18 162 lb (73.5 kg)    Physical Exam Vitals signs and nursing note reviewed.  Constitutional:      Appearance: Normal appearance. She is not ill-appearing.  HENT:     Head: Atraumatic.  Eyes:     Extraocular Movements: Extraocular  movements intact.     Conjunctiva/sclera: Conjunctivae normal.  Neck:     Musculoskeletal: Normal range of motion and neck supple.  Cardiovascular:     Rate and Rhythm: Normal rate and regular rhythm.     Heart sounds: Normal heart sounds.  Pulmonary:     Effort: Pulmonary effort is normal.     Breath sounds: Normal breath sounds.  Musculoskeletal: Normal range of motion.  Skin:    General: Skin is warm and dry.   Neurological:     Mental Status: She is alert and oriented to person, place, and time.  Psychiatric:        Mood and Affect: Mood normal.        Thought Content: Thought content normal.        Judgment: Judgment normal.     Results for orders placed or performed during the hospital encounter of 08/18/18  Gram stain  Result Value Ref Range   Specimen Description CSF    Special Requests TUBE 2    Gram Stain      WBC SEEN NO ORGANISMS SEEN Performed at [facility], [address] [place], [place] [address]    Report Status 08/19/2018 FINAL   Protime-INR  Result Value Ref Range   Prothrombin Time 13.0 11.4 - 15.2 seconds   INR 0.99   APTT  Result Value Ref Range   aPTT 28 24 - 36 seconds  CBC  Result Value Ref Range   WBC 6.3 4.0 - 10.5 K/uL   RBC 4.30 3.87 - 5.11 MIL/uL   Hemoglobin 12.5 12.0 - 15.0 g/dL   HCT 37.7 36.0 - 46.0 %   MCV 87.7 80.0 - 100.0 fL   MCH 29.1 26.0 - 34.0 pg   MCHC 33.2 30.0 - 36.0 g/dL   RDW 12.6 11.5 - 15.5 %   Platelets 208 150 - 400 K/uL   nRBC 0.0 0.0 - 0.2 %  Differential  Result Value Ref Range   Neutrophils Relative % 65 %   Neutro Abs 4.2 1.7 - 7.7 K/uL   Lymphocytes Relative 26 %   Lymphs Abs 1.6 0.7 - 4.0 K/uL   Monocytes Relative 6 %   Monocytes Absolute 0.4 0.1 - 1.0 K/uL   Eosinophils Relative 2 %   Eosinophils Absolute 0.1 0.0 - 0.5 K/uL   Basophils Relative 1 %   Basophils Absolute 0.0 0.0 - 0.1 K/uL   Immature Granulocytes 0 %   Abs Immature Granulocytes 0.01 0.00 - 0.07 K/uL  Comprehensive metabolic panel  Result Value Ref Range   Sodium 137 135 - 145 mmol/L   Potassium 3.4 (L) 3.5 - 5.1 mmol/L   Chloride 104 98 - 111 mmol/L   CO2 24 22 - 32 mmol/L   Glucose, Bld 107 (H) 70 - 99 mg/dL   BUN 10 6 - 20 mg/dL   Creatinine, Ser 0.62 0.44 - 1.00 mg/dL   Calcium 9.0 8.9 - 10.3 mg/dL   Total Protein 7.8 6.5 - 8.1 g/dL   Albumin 4.4 3.5 - 5.0 g/dL   AST 23 15 - 41 U/L   ALT 18 0 - 44 U/L   Alkaline  Phosphatase 23 (L) 38 - 126 U/L   Total Bilirubin 1.4 (H) 0.3 - 1.2 mg/dL   GFR calc non Af Amer >60 >60 mL/min   GFR calc Af Amer >60 >60 mL/min   Anion gap 9 5 - 15  Troponin I - Once  Result Value Ref Range  Troponin I <0.03 <0.03 ng/mL  Urinalysis, Complete w Microscopic  Result Value Ref Range   Color, Urine YELLOW (A) YELLOW   APPearance CLEAR (A) CLEAR   Specific Gravity, Urine 1.012 1.005 - 1.030   pH 6.0 5.0 - 8.0   Glucose, UA NEGATIVE NEGATIVE mg/dL   Hgb urine dipstick LARGE (A) NEGATIVE   Bilirubin Urine NEGATIVE NEGATIVE   Ketones, ur NEGATIVE NEGATIVE mg/dL   Protein, ur 30 (A) NEGATIVE mg/dL   Nitrite NEGATIVE NEGATIVE   Leukocytes, UA NEGATIVE NEGATIVE   RBC / HPF >50 (H) 0 - 5 RBC/hpf   WBC, UA 21-50 0 - 5 WBC/hpf   Bacteria, UA RARE (A) NONE SEEN   Squamous Epithelial / LPF 0-5 0 - 5   Mucus PRESENT   Urine Drug Screen, Qualitative ([facility] only)  Result Value Ref Range   Tricyclic, Ur Screen NONE DETECTED NONE DETECTED   Amphetamines, Ur Screen NONE DETECTED NONE DETECTED   MDMA (Ecstasy)Ur Screen NONE DETECTED NONE DETECTED   Cocaine Metabolite,Ur [place] NONE DETECTED NONE DETECTED   Opiate, Ur Screen NONE DETECTED NONE DETECTED   Phencyclidine (PCP) Ur S NONE DETECTED NONE DETECTED   Cannabinoid 50 Ng, Ur [place] NONE DETECTED NONE DETECTED   Barbiturates, Ur Screen NONE DETECTED NONE DETECTED   Benzodiazepine, Ur Scrn NONE DETECTED NONE DETECTED   Methadone Scn, Ur NONE DETECTED NONE DETECTED  Ethanol  Result Value Ref Range   Alcohol, Ethyl (B) <10 <10 mg/dL  Pregnancy, urine  Result Value Ref Range   Preg Test, Ur NEGATIVE NEGATIVE  TSH  Result Value Ref Range   TSH 0.477 0.350 - 4.500 uIU/mL  Glucose, CSF  Result Value Ref Range   Glucose, CSF 61 40 - 70 mg/dL  Protein, CSF  Result Value Ref Range   Total  Protein, CSF 38 15 - 45 mg/dL  CSF cell count with differential  Result Value Ref Range   Tube # 3    Color, CSF COLORLESS COLORLESS    Appearance, CSF CLEAR CLEAR   Supernatant CLEAR    RBC Count, CSF 734 (H) 0 - 3 /cu mm   WBC, CSF 4 0 - 5 /cu mm   Segmented Neutrophils-CSF 0 %   Lymphs, CSF 91 %   Monocyte-Macrophage-Spinal Fluid 9 %   Eosinophils, CSF 0 %  Oligoclonal bands, CSF + serm  Result Value Ref Range   CSF Oligoclonal Bands Comment   Potassium  Result Value Ref Range   Potassium 3.8 3.5 - 5.1 mmol/L  Basic metabolic panel  Result Value Ref Range   Sodium 139 135 - 145 mmol/L   Potassium 3.8 3.5 - 5.1 mmol/L   Chloride 106 98 - 111 mmol/L   CO2 28 22 - 32 mmol/L   Glucose, Bld 164 (H) 70 - 99 mg/dL   BUN 22 (H) 6 - 20 mg/dL   Creatinine, Ser 0.64 0.44 - 1.00 mg/dL   Calcium 8.9 8.9 - 10.3 mg/dL   GFR calc non Af Amer >60 >60 mL/min   GFR calc Af Amer >60 >60 mL/min   Anion gap 5 5 - 15  JC virus, PRC CSF  Result Value Ref Range   JC Virus PCR, CSF Negative Negative  HIV Antibody (routine testing w rflx)  Result Value Ref Range   HIV Screen 4th Generation wRfx Non Reactive Non Reactive  RPR  Result Value Ref Range   RPR Ser Ql Non Reactive Non Reactive  Sedimentation rate  Result  Value Ref Range   Sed Rate 8 0 - 20 mm/hr  ANA  Result Value Ref Range   Anti Nuclear Antibody(ANA) Negative Negative  JC Virus DNA,PCR (Whole Blood)  Result Value Ref Range   JC Virus DNA, PCR, Blood Negative Negative  POC Urine Pregnancy, ED  Result Value Ref Range   Preg Test, Ur Negative Negative  Cytology - Non PAP;  Result Value Ref Range   CYTOLOGY - NON GYN      Cytology - Non PAP CASE: [id number] PATIENT: Nettie Whitson Non-Gyn Cytology Report     SPECIMEN SUBMITTED: A. Cerebrospinal fluid  CLINICAL HISTORY: None provided  PRE-OPERATIVE DIAGNOSIS: None provided  POST-OPERATIVE DIAGNOSIS: None provided.     DIAGNOSIS: A. CEREBROSPINAL FLUID; LUMBAR PUNCTURE: - NO DEFINITE MALIGNANCY IDENTIFIED. - PREDOMINANTLY SMALL MATURE LYMPHOCYTES WITH FEW MONOCYTES, AND  RARE GRANULOCYTES. - NUMEROUS RED BLOOD CELLS PRESENT.   GROSS DESCRIPTION: A. Labeled: Labeled with patient's name and date of birth (per requisition, CSF) Received: Fresh Volume: 1 mL Description: Clear fluid in a plastic cerebrospinal fluid collection tube. Submitted for ThinPrep only.   Final Diagnosis performed by Allena Napoleon, MD.   Electronically signed 08/22/2018 2:05:25PM The electronic signature indicates that the named Attending Pathologist has evaluated the specimen  Technical component performed at [facility], [address] [place], [place] [address] Lab: [phone] Dir: Rush Farmer, MD, MMM  Professional component performed at [facility], [facility], [place], [place],  [address] Lab: [phone] Dir: Dellia Nims. Reuel Derby, MD       Assessment & Plan:   Problem List Items Addressed This Visit      Nervous and Auditory   Multiple sclerosis ([place]) - Primary    Followed by Neurology. Currently on nortriptyline and tolerating well. Continue per Neurology        Other   Endometriosis    Pt will establish with GYN for further management given worsening sxs          Follow up plan: Return if symptoms worsen or fail to improve.

## 2018-09-03 NOTE — Assessment & Plan Note (Signed)
Followed by Neurology. Currently on nortriptyline and tolerating well. Continue per Neurology

## 2018-09-03 NOTE — Assessment & Plan Note (Signed)
Pt will establish with GYN for further management given worsening sxs

## 2018-11-16 ENCOUNTER — Encounter: Payer: Self-pay | Admitting: Family Medicine

## 2018-11-16 ENCOUNTER — Other Ambulatory Visit: Payer: Self-pay

## 2018-11-16 ENCOUNTER — Ambulatory Visit (INDEPENDENT_AMBULATORY_CARE_PROVIDER_SITE_OTHER): Payer: BLUE CROSS/BLUE SHIELD | Admitting: Family Medicine

## 2018-11-16 VITALS — HR 50 | Ht 65.0 in | Wt 150.0 lb

## 2018-11-16 DIAGNOSIS — J301 Allergic rhinitis due to pollen: Secondary | ICD-10-CM | POA: Diagnosis not present

## 2018-11-16 DIAGNOSIS — J0100 Acute maxillary sinusitis, unspecified: Secondary | ICD-10-CM | POA: Diagnosis not present

## 2018-11-16 MED ORDER — AMOXICILLIN-POT CLAVULANATE 875-125 MG PO TABS: 1.0000 | ORAL_TABLET | Freq: Two times a day (BID) | ORAL | 0 refills | Status: DC

## 2018-11-16 NOTE — Progress Notes (Signed)
Pulse (!) 50   Ht 5\' 5"  (1.651 m)   Wt 150 lb (68 kg)   BMI 24.96 kg/m    Subjective:    Patient ID: Desiree Chavez, female    DOB: August 15, 1980, 38 y.o.   MRN: [id number]  HPI: Desiree Chavez is a 38 y.o. female  Chief Complaint  Patient presents with  . Sinusitis    Ongoing almost 1 week.   . Nasal Congestion  . Ear Pain    . This visit was completed via WebEx due to the restrictions of the COVID-19 pandemic. All issues as above were discussed and addressed. Physical exam was done as above through visual confirmation on WebEx. If it was felt that the patient should be evaluated in the office, they were directed there. The patient verbally consented to this visit. . Location of the patient: home . Location of the provider: home . Those involved with this call:  . Provider: Merrie Roof, PA-C . CMA: Merilyn Baba, [place] . Front Desk/Registration: Jill Side  . Time spent on call: 15 minutes with patient face to face via video conference. More than 50% of this time was spent in counseling and coordination of care. 5 minutes total spent in review of patient's record and preparation of their chart. I verified patient identity using two factors (patient name and date of birth). Patient consents verbally to being seen via telemedicine visit today.   Patient presenting with almost 1 week of worsening congestion, b/l ear pressure and pain, sinus pain and pressure, sinus headaches. Denies fever, chills, CP, SOB, cough, sore throat, sick contacts, exposures to COVID 19 she's aware of. Hx of allergic rhinitis currently on claritin and flonase regimen prn. These seem to help some.   Relevant past medical, surgical, family and social history reviewed and updated as indicated. Interim medical history since our last visit reviewed. Allergies and medications reviewed and updated.  Review of Systems  Per HPI unless specifically indicated above     Objective:    Pulse (!) 50   Ht  5\' 5"  (1.651 m)   Wt 150 lb (68 kg)   BMI 24.96 kg/m   Wt Readings from Last 3 Encounters:  11/16/18 150 lb (68 kg)  08/30/18 146 lb 14.4 oz (66.6 kg)  08/23/18 148 lb 9.4 oz (67.4 kg)    Physical Exam Vitals signs and nursing note reviewed.  Constitutional:      General: She is not in acute distress.    Appearance: Normal appearance.  HENT:     Head: Atraumatic.     Right Ear: External ear normal.     Left Ear: External ear normal.     Nose: Congestion present.     Mouth/Throat:     Mouth: Mucous membranes are moist.     Pharynx: Oropharynx is clear. Posterior oropharyngeal erythema present.  Eyes:     Extraocular Movements: Extraocular movements intact.     Conjunctiva/sclera: Conjunctivae normal.  Neck:     Musculoskeletal: Normal range of motion.  Cardiovascular:     Comments: Unable to assess via virtual visit Pulmonary:     Effort: Pulmonary effort is normal. No respiratory distress.  Musculoskeletal: Normal range of motion.  Skin:    General: Skin is dry.     Findings: No erythema.  Neurological:     Mental Status: She is alert and oriented to person, place, and time.  Psychiatric:        Mood and Affect: Mood normal.  Thought Content: Thought content normal.        Judgment: Judgment normal.     Results for orders placed or performed during the hospital encounter of 08/18/18  Gram stain  Result Value Ref Range   Specimen Description CSF    Special Requests TUBE 2    Gram Stain      WBC SEEN NO ORGANISMS SEEN Performed at [facility], [address] [place], [place] [address]    Report Status 08/19/2018 FINAL   Protime-INR  Result Value Ref Range   Prothrombin Time 13.0 11.4 - 15.2 seconds   INR 0.99   APTT  Result Value Ref Range   aPTT 28 24 - 36 seconds  CBC  Result Value Ref Range   WBC 6.3 4.0 - 10.5 K/uL   RBC 4.30 3.87 - 5.11 MIL/uL   Hemoglobin 12.5 12.0 - 15.0 g/dL   HCT 37.7 36.0 - 46.0 %   MCV 87.7 80.0 - 100.0 fL    MCH 29.1 26.0 - 34.0 pg   MCHC 33.2 30.0 - 36.0 g/dL   RDW 12.6 11.5 - 15.5 %   Platelets 208 150 - 400 K/uL   nRBC 0.0 0.0 - 0.2 %  Differential  Result Value Ref Range   Neutrophils Relative % 65 %   Neutro Abs 4.2 1.7 - 7.7 K/uL   Lymphocytes Relative 26 %   Lymphs Abs 1.6 0.7 - 4.0 K/uL   Monocytes Relative 6 %   Monocytes Absolute 0.4 0.1 - 1.0 K/uL   Eosinophils Relative 2 %   Eosinophils Absolute 0.1 0.0 - 0.5 K/uL   Basophils Relative 1 %   Basophils Absolute 0.0 0.0 - 0.1 K/uL   Immature Granulocytes 0 %   Abs Immature Granulocytes 0.01 0.00 - 0.07 K/uL  Comprehensive metabolic panel  Result Value Ref Range   Sodium 137 135 - 145 mmol/L   Potassium 3.4 (L) 3.5 - 5.1 mmol/L   Chloride 104 98 - 111 mmol/L   CO2 24 22 - 32 mmol/L   Glucose, Bld 107 (H) 70 - 99 mg/dL   BUN 10 6 - 20 mg/dL   Creatinine, Ser 0.62 0.44 - 1.00 mg/dL   Calcium 9.0 8.9 - 10.3 mg/dL   Total Protein 7.8 6.5 - 8.1 g/dL   Albumin 4.4 3.5 - 5.0 g/dL   AST 23 15 - 41 U/L   ALT 18 0 - 44 U/L   Alkaline Phosphatase 23 (L) 38 - 126 U/L   Total Bilirubin 1.4 (H) 0.3 - 1.2 mg/dL   GFR calc non Af Amer >60 >60 mL/min   GFR calc Af Amer >60 >60 mL/min   Anion gap 9 5 - 15  Troponin I - Once  Result Value Ref Range   Troponin I <0.03 <0.03 ng/mL  Urinalysis, Complete w Microscopic  Result Value Ref Range   Color, Urine YELLOW (A) YELLOW   APPearance CLEAR (A) CLEAR   Specific Gravity, Urine 1.012 1.005 - 1.030   pH 6.0 5.0 - 8.0   Glucose, UA NEGATIVE NEGATIVE mg/dL   Hgb urine dipstick LARGE (A) NEGATIVE   Bilirubin Urine NEGATIVE NEGATIVE   Ketones, ur NEGATIVE NEGATIVE mg/dL   Protein, ur 30 (A) NEGATIVE mg/dL   Nitrite NEGATIVE NEGATIVE   Leukocytes, UA NEGATIVE NEGATIVE   RBC / HPF >50 (H) 0 - 5 RBC/hpf   WBC, UA 21-50 0 - 5 WBC/hpf   Bacteria, UA RARE (A) NONE SEEN   Squamous  Epithelial / LPF 0-5 0 - 5   Mucus PRESENT   Urine Drug Screen, Qualitative ([facility] only)  Result Value Ref  Range   Tricyclic, Ur Screen NONE DETECTED NONE DETECTED   Amphetamines, Ur Screen NONE DETECTED NONE DETECTED   MDMA (Ecstasy)Ur Screen NONE DETECTED NONE DETECTED   Cocaine Metabolite,Ur [place] NONE DETECTED NONE DETECTED   Opiate, Ur Screen NONE DETECTED NONE DETECTED   Phencyclidine (PCP) Ur S NONE DETECTED NONE DETECTED   Cannabinoid 50 Ng, Ur [place] NONE DETECTED NONE DETECTED   Barbiturates, Ur Screen NONE DETECTED NONE DETECTED   Benzodiazepine, Ur Scrn NONE DETECTED NONE DETECTED   Methadone Scn, Ur NONE DETECTED NONE DETECTED  Ethanol  Result Value Ref Range   Alcohol, Ethyl (B) <10 <10 mg/dL  Pregnancy, urine  Result Value Ref Range   Preg Test, Ur NEGATIVE NEGATIVE  TSH  Result Value Ref Range   TSH 0.477 0.350 - 4.500 uIU/mL  Glucose, CSF  Result Value Ref Range   Glucose, CSF 61 40 - 70 mg/dL  Protein, CSF  Result Value Ref Range   Total  Protein, CSF 38 15 - 45 mg/dL  CSF cell count with differential  Result Value Ref Range   Tube # 3    Color, CSF COLORLESS COLORLESS   Appearance, CSF CLEAR CLEAR   Supernatant CLEAR    RBC Count, CSF 734 (H) 0 - 3 /cu mm   WBC, CSF 4 0 - 5 /cu mm   Segmented Neutrophils-CSF 0 %   Lymphs, CSF 91 %   Monocyte-Macrophage-Spinal Fluid 9 %   Eosinophils, CSF 0 %  Oligoclonal bands, CSF + serm  Result Value Ref Range   CSF Oligoclonal Bands Comment   Potassium  Result Value Ref Range   Potassium 3.8 3.5 - 5.1 mmol/L  Basic metabolic panel  Result Value Ref Range   Sodium 139 135 - 145 mmol/L   Potassium 3.8 3.5 - 5.1 mmol/L   Chloride 106 98 - 111 mmol/L   CO2 28 22 - 32 mmol/L   Glucose, Bld 164 (H) 70 - 99 mg/dL   BUN 22 (H) 6 - 20 mg/dL   Creatinine, Ser 0.64 0.44 - 1.00 mg/dL   Calcium 8.9 8.9 - 10.3 mg/dL   GFR calc non Af Amer >60 >60 mL/min   GFR calc Af Amer >60 >60 mL/min   Anion gap 5 5 - 15  JC virus, PRC CSF  Result Value Ref Range   JC Virus PCR, CSF Negative Negative  HIV Antibody (routine testing w rflx)   Result Value Ref Range   HIV Screen 4th Generation wRfx Non Reactive Non Reactive  RPR  Result Value Ref Range   RPR Ser Ql Non Reactive Non Reactive  Sedimentation rate  Result Value Ref Range   Sed Rate 8 0 - 20 mm/hr  ANA  Result Value Ref Range   Anti Nuclear Antibody(ANA) Negative Negative  JC Virus DNA,PCR (Whole Blood)  Result Value Ref Range   JC Virus DNA, PCR, Blood Negative Negative  POC Urine Pregnancy, ED  Result Value Ref Range   Preg Test, Ur Negative Negative  Cytology - Non PAP;  Result Value Ref Range   CYTOLOGY - NON GYN      Cytology - Non PAP CASE: [id number] PATIENT: Lakira Tegethoff Non-Gyn Cytology Report     SPECIMEN SUBMITTED: A. Cerebrospinal fluid  CLINICAL HISTORY: None provided  PRE-OPERATIVE DIAGNOSIS: None provided  POST-OPERATIVE DIAGNOSIS: None provided.  DIAGNOSIS: A. CEREBROSPINAL FLUID; LUMBAR PUNCTURE: - NO DEFINITE MALIGNANCY IDENTIFIED. - PREDOMINANTLY SMALL MATURE LYMPHOCYTES WITH FEW MONOCYTES, AND RARE GRANULOCYTES. - NUMEROUS RED BLOOD CELLS PRESENT.   GROSS DESCRIPTION: A. Labeled: Labeled with patient's name and date of birth (per requisition, CSF) Received: Fresh Volume: 1 mL Description: Clear fluid in a plastic cerebrospinal fluid collection tube. Submitted for ThinPrep only.   Final Diagnosis performed by Allena Napoleon, MD.   Electronically signed 08/22/2018 2:05:25PM The electronic signature indicates that the named Attending Pathologist has evaluated the specimen  Technical component performed at [facility], [address] [place], [place] [address] Lab: [phone] Dir: Rush Farmer, MD, MMM  Professional component performed at [facility], [facility] [place], [place], [place] [address] Lab: [phone] Dir: Dellia Nims. Rubinas, MD       Assessment & Plan:   Problem List Items Addressed This Visit      Respiratory   Allergic rhinitis    Continue current regimen,  recommended taking daily during seasons that she typically struggles with such as spring. Reviewed proper use of nasal spray.        Other Visit Diagnoses    Acute maxillary sinusitis, recurrence not specified    -  Primary   Tx with augmentin, continued allergy regimen, sinus rinses, humidifier, mucinex. Return precautions given if worsening or not improving   Relevant Medications   loratadine (CLARITIN) 10 MG tablet   amoxicillin-clavulanate (AUGMENTIN) 875-125 MG tablet       Follow up plan: Return if symptoms worsen or fail to improve.

## 2018-11-21 NOTE — Assessment & Plan Note (Signed)
Continue current regimen, recommended taking daily during seasons that she typically struggles with such as spring. Reviewed proper use of nasal spray.

## 2018-12-12 DIAGNOSIS — Z79899 Other long term (current) drug therapy: Secondary | ICD-10-CM | POA: Diagnosis not present

## 2018-12-12 DIAGNOSIS — G35 Multiple sclerosis: Secondary | ICD-10-CM | POA: Diagnosis not present

## 2018-12-19 DIAGNOSIS — G35 Multiple sclerosis: Secondary | ICD-10-CM | POA: Diagnosis not present

## 2019-01-30 DIAGNOSIS — G35 Multiple sclerosis: Secondary | ICD-10-CM | POA: Diagnosis not present

## 2019-02-20 DIAGNOSIS — R519 Headache, unspecified: Secondary | ICD-10-CM | POA: Insufficient documentation

## 2019-02-20 DIAGNOSIS — R51 Headache: Secondary | ICD-10-CM | POA: Diagnosis not present

## 2019-02-20 DIAGNOSIS — G35 Multiple sclerosis: Secondary | ICD-10-CM | POA: Diagnosis not present

## 2019-03-24 DIAGNOSIS — G35 Multiple sclerosis: Secondary | ICD-10-CM | POA: Diagnosis not present

## 2019-03-27 ENCOUNTER — Encounter: Payer: BLUE CROSS/BLUE SHIELD | Admitting: Family Medicine

## 2019-04-12 DIAGNOSIS — G35 Multiple sclerosis: Secondary | ICD-10-CM | POA: Diagnosis not present

## 2019-04-14 DIAGNOSIS — G35 Multiple sclerosis: Secondary | ICD-10-CM | POA: Diagnosis not present

## 2019-08-23 DIAGNOSIS — R29818 Other symptoms and signs involving the nervous system: Secondary | ICD-10-CM | POA: Diagnosis not present

## 2019-08-23 DIAGNOSIS — R519 Headache, unspecified: Secondary | ICD-10-CM | POA: Diagnosis not present

## 2019-08-23 DIAGNOSIS — R29898 Other symptoms and signs involving the musculoskeletal system: Secondary | ICD-10-CM | POA: Diagnosis not present

## 2019-08-23 DIAGNOSIS — G35 Multiple sclerosis: Secondary | ICD-10-CM | POA: Diagnosis not present

## 2019-08-28 DIAGNOSIS — R29898 Other symptoms and signs involving the musculoskeletal system: Secondary | ICD-10-CM | POA: Insufficient documentation

## 2019-08-28 DIAGNOSIS — M79641 Pain in right hand: Secondary | ICD-10-CM | POA: Insufficient documentation

## 2019-08-28 DIAGNOSIS — R29818 Other symptoms and signs involving the nervous system: Secondary | ICD-10-CM | POA: Insufficient documentation

## 2019-09-27 DIAGNOSIS — G35 Multiple sclerosis: Secondary | ICD-10-CM | POA: Diagnosis not present

## 2019-10-26 ENCOUNTER — Other Ambulatory Visit: Payer: Self-pay

## 2019-10-26 ENCOUNTER — Ambulatory Visit: Payer: BC Managed Care – PPO | Attending: Internal Medicine

## 2019-10-26 DIAGNOSIS — Z23 Encounter for immunization: Secondary | ICD-10-CM

## 2019-10-26 NOTE — Progress Notes (Signed)
   Covid-19 Vaccination Clinic  Name:  Desiree Chavez    MRN: [id number] DOB: 04-13-1981  10/26/2019  Desiree Chavez was observed post Covid-19 immunization for 15 minutes without incident. She was provided with Vaccine Information Sheet and instruction to access the V-Safe system.   Desiree Chavez was instructed to call 911 with any severe reactions post vaccine: Marland Kitchen Difficulty breathing  . Swelling of face and throat  . A fast heartbeat  . A bad rash all over body  . Dizziness and weakness   Immunizations Administered    Name Date Dose VIS Date Route   Pfizer COVID-19 Vaccine 10/26/2019  9:39 AM 0.3 mL 06/23/2019 Intramuscular   Manufacturer: [place]   Lot: [id number]   [place]: [id number]

## 2019-11-13 ENCOUNTER — Encounter: Payer: Self-pay | Admitting: Family Medicine

## 2019-11-13 ENCOUNTER — Telehealth (INDEPENDENT_AMBULATORY_CARE_PROVIDER_SITE_OTHER): Payer: BC Managed Care – PPO | Admitting: Family Medicine

## 2019-11-13 VITALS — Temp 93.3°F

## 2019-11-13 DIAGNOSIS — R300 Dysuria: Secondary | ICD-10-CM

## 2019-11-13 DIAGNOSIS — J0100 Acute maxillary sinusitis, unspecified: Secondary | ICD-10-CM

## 2019-11-13 MED ORDER — DOXYCYCLINE HYCLATE 100 MG PO TABS: 100.0000 mg | ORAL_TABLET | Freq: Two times a day (BID) | ORAL | 0 refills | Status: DC

## 2019-11-13 NOTE — Progress Notes (Signed)
Temp (!) 93.3 F (34.1 C)    Subjective:    Patient ID: Desiree Chavez, female    DOB: January 26, 1981, 39 y.o.   MRN: [id number]  HPI: Desiree Chavez is a 39 y.o. female  Chief Complaint  Patient presents with  . URI    face pain, sore throat due to post nasal drainage.   . Urinary Tract Infection    urinary urgency, freguency, and buring with urination, with a constant throbbing     . This visit was completed via MyChart due to the restrictions of the COVID-19 pandemic. All issues as above were discussed and addressed. Physical exam was done as above through visual confirmation on MyChart. If it was felt that the patient should be evaluated in the office, they were directed there. The patient verbally consented to this visit. . Location of the patient: home . Location of the provider: work . Those involved with this call:  . Provider: Merrie Roof, PA-C . CMA: Lesle Chris, [place] . Front Desk/Registration: Jill Side  . Time spent on call: 15 minutes with patient face to face via video conference. More than 50% of this time was spent in counseling and coordination of care. 5 minutes total spent in review of patient's record and preparation of their chart. I verified patient identity using two factors (patient name and date of birth). Patient consents verbally to being seen via telemedicine visit today.   Over a week of sinus pain and pressure, congestion, sore throat. Denies fever, chills, cough, CP, SOB. Hx of allergic rhinitis, on allegra and flonase regimen. Not trying anything OTC for sxs. No sick contacts.   Dysuria, urinary urgency, cloudy urine, and frequency the past 3 days. Denies fever, chills, N/V. Not trying anything OTC for sxs.   Relevant past medical, surgical, family and social history reviewed and updated as indicated. Interim medical history since our last visit reviewed. Allergies and medications reviewed and updated.  Review of Systems  Per HPI unless  specifically indicated above     Objective:    Temp (!) 93.3 F (34.1 C)   Wt Readings from Last 3 Encounters:  11/16/18 150 lb (68 kg)  08/30/18 146 lb 14.4 oz (66.6 kg)  08/23/18 148 lb 9.4 oz (67.4 kg)    Physical Exam Vitals and nursing note reviewed.  Constitutional:      General: She is not in acute distress.    Appearance: Normal appearance.  HENT:     Head: Atraumatic.     Right Ear: External ear normal.     Left Ear: External ear normal.     Nose: Congestion present.     Mouth/Throat:     Mouth: Mucous membranes are moist.     Pharynx: Oropharynx is clear. Posterior oropharyngeal erythema present.  Eyes:     Extraocular Movements: Extraocular movements intact.     Conjunctiva/sclera: Conjunctivae normal.  Cardiovascular:     Comments: Unable to assess via virtual visit Pulmonary:     Effort: Pulmonary effort is normal. No respiratory distress.  Musculoskeletal:        General: Normal range of motion.     Cervical back: Normal range of motion.  Skin:    General: Skin is dry.     Findings: No erythema.  Neurological:     Mental Status: She is alert and oriented to person, place, and time.  Psychiatric:        Mood and Affect: Mood normal.  Thought Content: Thought content normal.        Judgment: Judgment normal.     Results for orders placed or performed during the hospital encounter of 08/18/18  Gram stain   Specimen: CSF  Result Value Ref Range   Specimen Description CSF    Special Requests TUBE 2    Gram Stain      WBC SEEN NO ORGANISMS SEEN Performed at [facility], [address] [place], [place] [address]    Report Status 08/19/2018 FINAL   Protime-INR  Result Value Ref Range   Prothrombin Time 13.0 11.4 - 15.2 seconds   INR 0.99   APTT  Result Value Ref Range   aPTT 28 24 - 36 seconds  CBC  Result Value Ref Range   WBC 6.3 4.0 - 10.5 K/uL   RBC 4.30 3.87 - 5.11 MIL/uL   Hemoglobin 12.5 12.0 - 15.0 g/dL   HCT 37.7  36.0 - 46.0 %   MCV 87.7 80.0 - 100.0 fL   MCH 29.1 26.0 - 34.0 pg   MCHC 33.2 30.0 - 36.0 g/dL   RDW 12.6 11.5 - 15.5 %   Platelets 208 150 - 400 K/uL   nRBC 0.0 0.0 - 0.2 %  Differential  Result Value Ref Range   Neutrophils Relative % 65 %   Neutro Abs 4.2 1.7 - 7.7 K/uL   Lymphocytes Relative 26 %   Lymphs Abs 1.6 0.7 - 4.0 K/uL   Monocytes Relative 6 %   Monocytes Absolute 0.4 0.1 - 1.0 K/uL   Eosinophils Relative 2 %   Eosinophils Absolute 0.1 0.0 - 0.5 K/uL   Basophils Relative 1 %   Basophils Absolute 0.0 0.0 - 0.1 K/uL   Immature Granulocytes 0 %   Abs Immature Granulocytes 0.01 0.00 - 0.07 K/uL  Comprehensive metabolic panel  Result Value Ref Range   Sodium 137 135 - 145 mmol/L   Potassium 3.4 (L) 3.5 - 5.1 mmol/L   Chloride 104 98 - 111 mmol/L   CO2 24 22 - 32 mmol/L   Glucose, Bld 107 (H) 70 - 99 mg/dL   BUN 10 6 - 20 mg/dL   Creatinine, Ser 0.62 0.44 - 1.00 mg/dL   Calcium 9.0 8.9 - 10.3 mg/dL   Total Protein 7.8 6.5 - 8.1 g/dL   Albumin 4.4 3.5 - 5.0 g/dL   AST 23 15 - 41 U/L   ALT 18 0 - 44 U/L   Alkaline Phosphatase 23 (L) 38 - 126 U/L   Total Bilirubin 1.4 (H) 0.3 - 1.2 mg/dL   GFR calc non Af Amer >60 >60 mL/min   GFR calc Af Amer >60 >60 mL/min   Anion gap 9 5 - 15  Troponin I - Once  Result Value Ref Range   Troponin I <0.03 <0.03 ng/mL  Urinalysis, Complete w Microscopic  Result Value Ref Range   Color, Urine YELLOW (A) YELLOW   APPearance CLEAR (A) CLEAR   Specific Gravity, Urine 1.012 1.005 - 1.030   pH 6.0 5.0 - 8.0   Glucose, UA NEGATIVE NEGATIVE mg/dL   Hgb urine dipstick LARGE (A) NEGATIVE   Bilirubin Urine NEGATIVE NEGATIVE   Ketones, ur NEGATIVE NEGATIVE mg/dL   Protein, ur 30 (A) NEGATIVE mg/dL   Nitrite NEGATIVE NEGATIVE   Leukocytes, UA NEGATIVE NEGATIVE   RBC / HPF >50 (H) 0 - 5 RBC/hpf   WBC, UA 21-50 0 - 5 WBC/hpf   Bacteria, UA RARE (A) NONE  SEEN   Squamous Epithelial / LPF 0-5 0 - 5   Mucus PRESENT   Urine Drug Screen,  Qualitative ([facility] only)  Result Value Ref Range   Tricyclic, Ur Screen NONE DETECTED NONE DETECTED   Amphetamines, Ur Screen NONE DETECTED NONE DETECTED   MDMA (Ecstasy)Ur Screen NONE DETECTED NONE DETECTED   Cocaine Metabolite,Ur [place] NONE DETECTED NONE DETECTED   Opiate, Ur Screen NONE DETECTED NONE DETECTED   Phencyclidine (PCP) Ur S NONE DETECTED NONE DETECTED   Cannabinoid 50 Ng, Ur [place] NONE DETECTED NONE DETECTED   Barbiturates, Ur Screen NONE DETECTED NONE DETECTED   Benzodiazepine, Ur Scrn NONE DETECTED NONE DETECTED   Methadone Scn, Ur NONE DETECTED NONE DETECTED  Ethanol  Result Value Ref Range   Alcohol, Ethyl (B) <10 <10 mg/dL  Pregnancy, urine  Result Value Ref Range   Preg Test, Ur NEGATIVE NEGATIVE  TSH  Result Value Ref Range   TSH 0.477 0.350 - 4.500 uIU/mL  Glucose, CSF  Result Value Ref Range   Glucose, CSF 61 40 - 70 mg/dL  Protein, CSF  Result Value Ref Range   Total  Protein, CSF 38 15 - 45 mg/dL  CSF cell count with differential  Result Value Ref Range   Tube # 3    Color, CSF COLORLESS COLORLESS   Appearance, CSF CLEAR CLEAR   Supernatant CLEAR    RBC Count, CSF 734 (H) 0 - 3 /cu mm   WBC, CSF 4 0 - 5 /cu mm   Segmented Neutrophils-CSF 0 %   Lymphs, CSF 91 %   Monocyte-Macrophage-Spinal Fluid 9 %   Eosinophils, CSF 0 %  Oligoclonal bands, CSF + serm  Result Value Ref Range   CSF Oligoclonal Bands Comment   Potassium  Result Value Ref Range   Potassium 3.8 3.5 - 5.1 mmol/L  Basic metabolic panel  Result Value Ref Range   Sodium 139 135 - 145 mmol/L   Potassium 3.8 3.5 - 5.1 mmol/L   Chloride 106 98 - 111 mmol/L   CO2 28 22 - 32 mmol/L   Glucose, Bld 164 (H) 70 - 99 mg/dL   BUN 22 (H) 6 - 20 mg/dL   Creatinine, Ser 0.64 0.44 - 1.00 mg/dL   Calcium 8.9 8.9 - 10.3 mg/dL   GFR calc non Af Amer >60 >60 mL/min   GFR calc Af Amer >60 >60 mL/min   Anion gap 5 5 - 15  JC virus, PRC CSF  Result Value Ref Range   JC Virus PCR, CSF Negative Negative   HIV Antibody (routine testing w rflx)  Result Value Ref Range   HIV Screen 4th Generation wRfx Non Reactive Non Reactive  RPR  Result Value Ref Range   RPR Ser Ql Non Reactive Non Reactive  Sedimentation rate  Result Value Ref Range   Sed Rate 8 0 - 20 mm/hr  ANA  Result Value Ref Range   Anti Nuclear Antibody(ANA) Negative Negative  JC Virus DNA,PCR (Whole Blood)  Result Value Ref Range   JC Virus DNA, PCR, Blood Negative Negative  POC Urine Pregnancy, ED  Result Value Ref Range   Preg Test, Ur Negative Negative  Cytology - Non PAP;  Result Value Ref Range   CYTOLOGY - NON GYN      Cytology - Non PAP CASE: [id number] PATIENT: Landrie Burroughs Non-Gyn Cytology Report     SPECIMEN SUBMITTED: A. Cerebrospinal fluid  CLINICAL HISTORY: None provided  PRE-OPERATIVE DIAGNOSIS: None provided  POST-OPERATIVE  DIAGNOSIS: None provided.     DIAGNOSIS: A. CEREBROSPINAL FLUID; LUMBAR PUNCTURE: - NO DEFINITE MALIGNANCY IDENTIFIED. - PREDOMINANTLY SMALL MATURE LYMPHOCYTES WITH FEW MONOCYTES, AND RARE GRANULOCYTES. - NUMEROUS RED BLOOD CELLS PRESENT.   GROSS DESCRIPTION: A. Labeled: Labeled with patient's name and date of birth (per requisition, CSF) Received: Fresh Volume: 1 mL Description: Clear fluid in a plastic cerebrospinal fluid collection tube. Submitted for ThinPrep only.   Final Diagnosis performed by Allena Napoleon, MD.   Electronically signed 08/22/2018 2:05:25PM The electronic signature indicates that the named Attending Pathologist has evaluated the specimen  Technical component performed at [facility], [address] [place], [place] [address] Lab: [phone] Dir: Rush Farmer, MD, MMM  Professional component performed at [facility], [facility] [place], [place], [place] [address] Lab: [phone] Dir: Dellia Nims. Rubinas, MD       Assessment & Plan:   Problem List Items Addressed This Visit    None    Visit  Diagnoses    Acute non-recurrent maxillary sinusitis    -  Primary   Tx with doxycycline, mucinex, saline rinses, and continued allergy regimen. Monitor for benefit   Relevant Medications   fexofenadine (ALLEGRA) 180 MG tablet   doxycycline (VIBRA-TABS) 100 MG tablet   Dysuria       Will obtain U/A prior to starting doxycycline and await urine results/culture and adjust if needed. Push fluids, probiotics   Relevant Orders   UA/M w/rflx Culture, Routine       Follow up plan: Return if symptoms worsen or fail to improve.

## 2019-11-16 LAB — UA/M W/RFLX CULTURE, ROUTINE
Bilirubin, UA: NEGATIVE
Glucose, UA: NEGATIVE
Ketones, UA: NEGATIVE
Nitrite, UA: POSITIVE — AB
Specific Gravity, UA: 1.02 (ref 1.005–1.030)
Urobilinogen, Ur: 0.2 mg/dL (ref 0.2–1.0)
pH, UA: 6 (ref 5.0–7.5)

## 2019-11-16 LAB — MICROSCOPIC EXAMINATION: WBC, UA: >30 /hpf — AB (ref 0–5)

## 2019-11-16 LAB — URINE CULTURE, REFLEX

## 2019-11-21 ENCOUNTER — Ambulatory Visit: Payer: BC Managed Care – PPO | Attending: Internal Medicine

## 2019-11-21 DIAGNOSIS — Z23 Encounter for immunization: Secondary | ICD-10-CM

## 2019-11-21 NOTE — Progress Notes (Signed)
   Covid-19 Vaccination Clinic  Name:  Desiree Chavez    MRN: [id number] DOB: 30-Jan-1981  11/21/2019  [name] was observed post Covid-19 immunization for 15 minutes without incident. She was provided with Vaccine Information Sheet and instruction to access the V-Safe system.   [name] was instructed to call 911 with any severe reactions post vaccine: Marland Kitchen Difficulty breathing  . Swelling of face and throat  . A fast heartbeat  . A bad rash all over body  . Dizziness and weakness   Immunizations Administered    Name Date Dose VIS Date Route   Pfizer COVID-19 Vaccine 11/21/2019 10:43 AM 0.3 mL 09/06/2018 Intramuscular   Manufacturer: [place]   Lot: [id number]   [place]: [id number]

## 2019-12-25 DIAGNOSIS — R519 Headache, unspecified: Secondary | ICD-10-CM | POA: Diagnosis not present

## 2019-12-25 DIAGNOSIS — M79641 Pain in right hand: Secondary | ICD-10-CM | POA: Diagnosis not present

## 2019-12-25 DIAGNOSIS — G35 Multiple sclerosis: Secondary | ICD-10-CM | POA: Diagnosis not present

## 2019-12-25 DIAGNOSIS — R29818 Other symptoms and signs involving the nervous system: Secondary | ICD-10-CM | POA: Diagnosis not present

## 2020-06-17 DIAGNOSIS — R29818 Other symptoms and signs involving the nervous system: Secondary | ICD-10-CM | POA: Diagnosis not present

## 2020-06-17 DIAGNOSIS — Z79899 Other long term (current) drug therapy: Secondary | ICD-10-CM | POA: Diagnosis not present

## 2020-06-17 DIAGNOSIS — M79671 Pain in right foot: Secondary | ICD-10-CM | POA: Insufficient documentation

## 2020-06-17 DIAGNOSIS — R29898 Other symptoms and signs involving the musculoskeletal system: Secondary | ICD-10-CM | POA: Diagnosis not present

## 2020-06-17 DIAGNOSIS — G35 Multiple sclerosis: Secondary | ICD-10-CM | POA: Diagnosis not present

## 2020-06-20 DIAGNOSIS — G35 Multiple sclerosis: Secondary | ICD-10-CM | POA: Diagnosis not present

## 2020-07-23 IMAGING — RF DG SPINAL PUNCT LUMBAR DIAG WITH FL CT GUIDANCE
1 series · 2 of 2 positions shown · non-contrast
Comparison: none

CLINICAL DATA: Multiple sclerosis.

[Series 1: fluoro_iodine 2fps_bw · 0.17mm/px · 2 of 2 frames shown]
[frame 1/2]
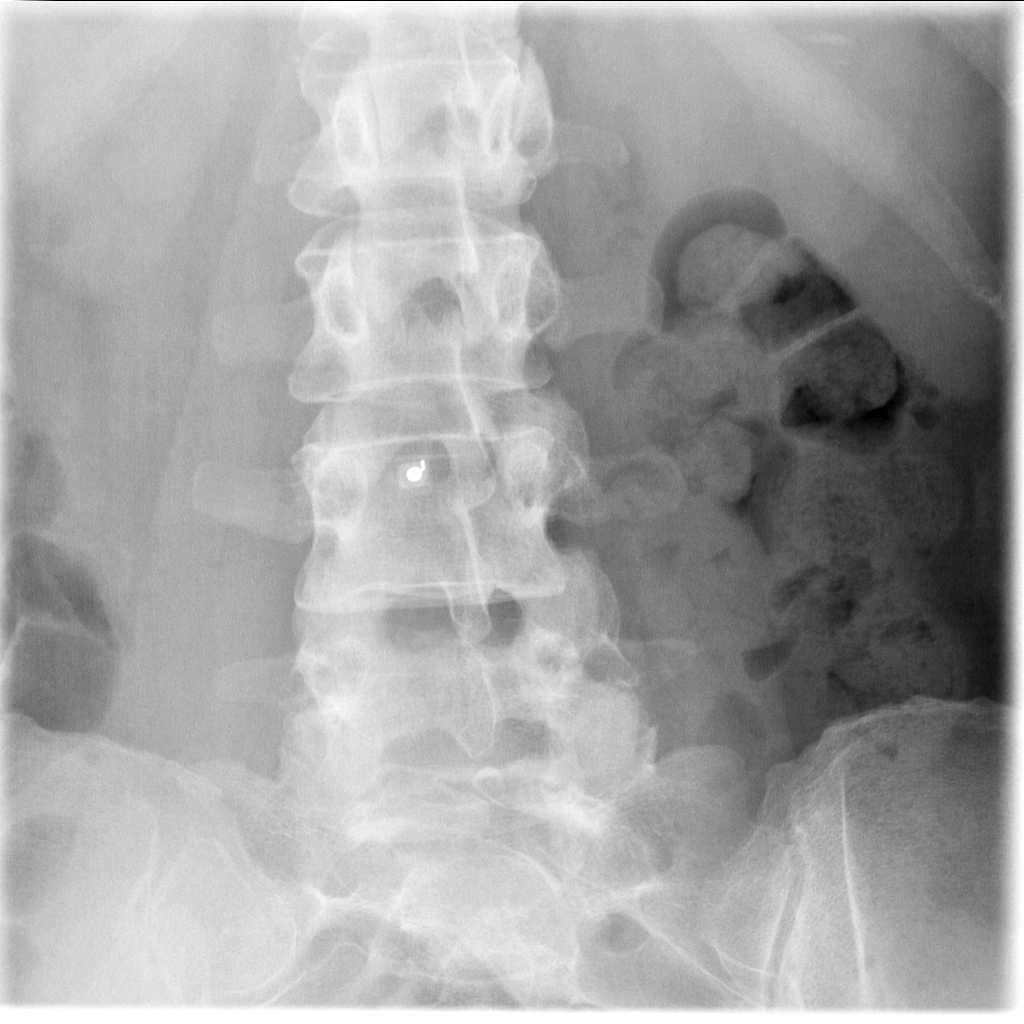
[frame 2/2]
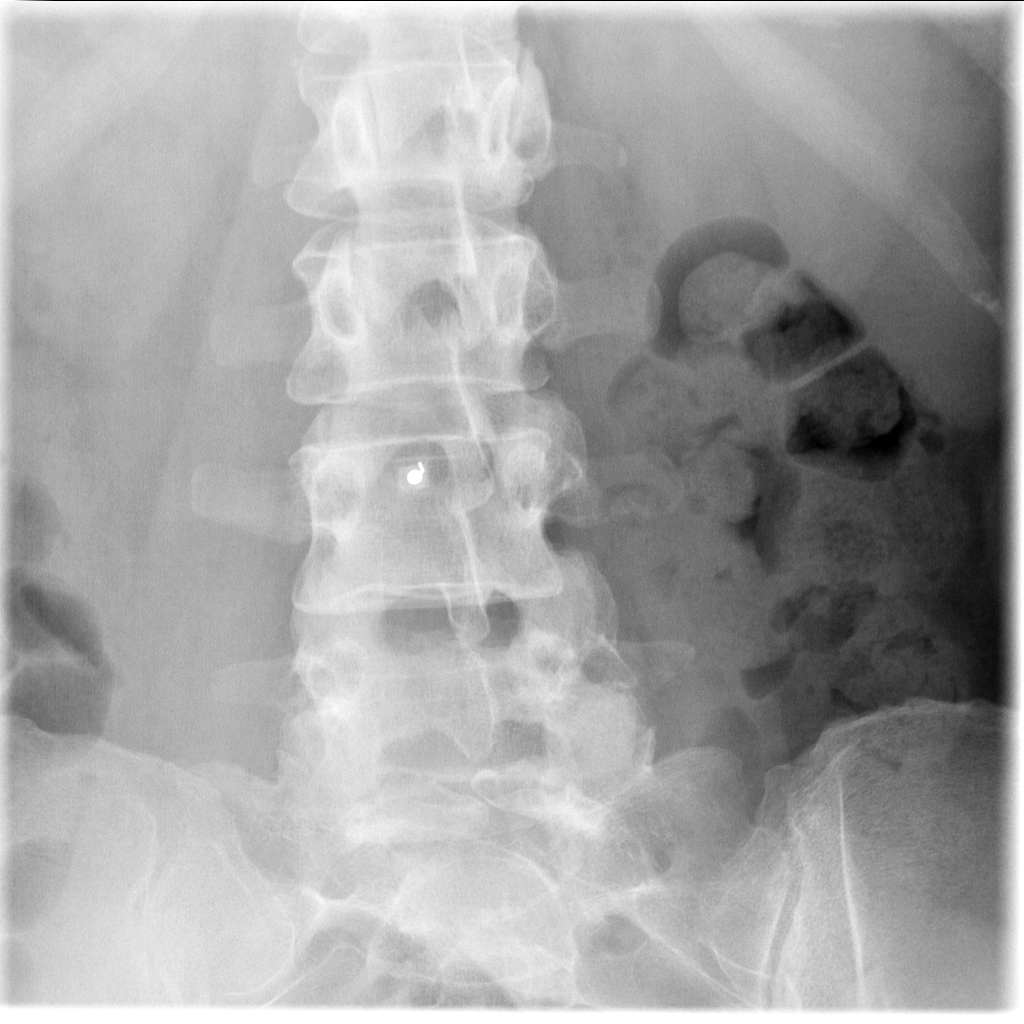

[2 of 2 positions shown; findings below may reference images not displayed]

EXAM:
DIAGNOSTIC LUMBAR PUNCTURE UNDER FLUOROSCOPIC GUIDANCE

FLUOROSCOPY TIME:  Fluoroscopy Time:  0 minutes 48 seconds.

Radiation Exposure Index (if provided by the fluoroscopic device):
10.8 mGy

Number of Acquired Spot Images: 1

PROCEDURE:
Informed consent was obtained from the patient prior to the
procedure, including potential complications of headache, bleeding,
allergy, and pain. With the patient prone, the lower back was
prepped with Betadine. 1% Lidocaine was used for local anesthesia.
Lumbar puncture was performed at the L3-L4 level using a 22 gauge
needle with return of clear CSF. 10 Ml of CSF were obtained for
laboratory studies. The patient tolerated the procedure well and
there were no apparent complications.
IMPRESSION: Successful lumbar puncture with CSF sent requested labs.

## 2020-09-02 DIAGNOSIS — R29818 Other symptoms and signs involving the nervous system: Secondary | ICD-10-CM | POA: Diagnosis not present

## 2020-09-02 DIAGNOSIS — R29898 Other symptoms and signs involving the musculoskeletal system: Secondary | ICD-10-CM | POA: Diagnosis not present

## 2020-09-02 DIAGNOSIS — M79671 Pain in right foot: Secondary | ICD-10-CM | POA: Diagnosis not present

## 2020-09-02 DIAGNOSIS — G35 Multiple sclerosis: Secondary | ICD-10-CM | POA: Diagnosis not present

## 2020-09-04 DIAGNOSIS — E538 Deficiency of other specified B group vitamins: Secondary | ICD-10-CM | POA: Diagnosis not present

## 2020-10-02 DIAGNOSIS — E538 Deficiency of other specified B group vitamins: Secondary | ICD-10-CM | POA: Diagnosis not present

## 2020-10-30 DIAGNOSIS — E538 Deficiency of other specified B group vitamins: Secondary | ICD-10-CM | POA: Diagnosis not present

## 2020-12-04 DIAGNOSIS — E538 Deficiency of other specified B group vitamins: Secondary | ICD-10-CM | POA: Diagnosis not present

## 2021-01-01 DIAGNOSIS — E538 Deficiency of other specified B group vitamins: Secondary | ICD-10-CM | POA: Diagnosis not present

## 2021-03-13 DIAGNOSIS — R29818 Other symptoms and signs involving the nervous system: Secondary | ICD-10-CM | POA: Diagnosis not present

## 2021-03-13 DIAGNOSIS — E538 Deficiency of other specified B group vitamins: Secondary | ICD-10-CM | POA: Diagnosis not present

## 2021-03-13 DIAGNOSIS — E559 Vitamin D deficiency, unspecified: Secondary | ICD-10-CM | POA: Diagnosis not present

## 2021-03-13 DIAGNOSIS — G35 Multiple sclerosis: Secondary | ICD-10-CM | POA: Diagnosis not present

## 2021-05-13 DIAGNOSIS — G35 Multiple sclerosis: Secondary | ICD-10-CM | POA: Diagnosis not present

## 2021-05-13 DIAGNOSIS — R519 Headache, unspecified: Secondary | ICD-10-CM | POA: Diagnosis not present

## 2021-05-13 DIAGNOSIS — R29898 Other symptoms and signs involving the musculoskeletal system: Secondary | ICD-10-CM | POA: Diagnosis not present

## 2021-05-13 DIAGNOSIS — R29818 Other symptoms and signs involving the nervous system: Secondary | ICD-10-CM | POA: Diagnosis not present

## 2021-05-16 ENCOUNTER — Encounter: Payer: Self-pay | Admitting: Family Medicine

## 2021-05-16 ENCOUNTER — Other Ambulatory Visit: Payer: Self-pay

## 2021-05-16 ENCOUNTER — Ambulatory Visit (INDEPENDENT_AMBULATORY_CARE_PROVIDER_SITE_OTHER): Payer: BC Managed Care – PPO | Admitting: Family Medicine

## 2021-05-16 VITALS — BP 87/59 | HR 85 | Temp 98.2°F | Ht 65.5 in | Wt 171.0 lb

## 2021-05-16 DIAGNOSIS — L259 Unspecified contact dermatitis, unspecified cause: Secondary | ICD-10-CM | POA: Diagnosis not present

## 2021-05-16 DIAGNOSIS — Z0000 Encounter for general adult medical examination without abnormal findings: Secondary | ICD-10-CM | POA: Diagnosis not present

## 2021-05-16 DIAGNOSIS — Z1231 Encounter for screening mammogram for malignant neoplasm of breast: Secondary | ICD-10-CM

## 2021-05-16 DIAGNOSIS — Z23 Encounter for immunization: Secondary | ICD-10-CM

## 2021-05-16 LAB — URINALYSIS, ROUTINE W REFLEX MICROSCOPIC
Bilirubin, UA: NEGATIVE
Glucose, UA: NEGATIVE
Ketones, UA: NEGATIVE
Leukocytes,UA: NEGATIVE
Nitrite, UA: NEGATIVE
Protein,UA: NEGATIVE
RBC, UA: NEGATIVE
Specific Gravity, UA: 1.015 (ref 1.005–1.030)
Urobilinogen, Ur: 0.2 mg/dL (ref 0.2–1.0)
pH, UA: 5.5 (ref 5.0–7.5)

## 2021-05-16 MED ORDER — TRIAMCINOLONE ACETONIDE 0.5 % EX OINT: 1.0000 "application " | TOPICAL_OINTMENT | Freq: Two times a day (BID) | CUTANEOUS | 0 refills | Status: DC

## 2021-05-16 NOTE — Patient Instructions (Signed)
Call to schedule your mammogram: [facility]  Address: [address], [place], [place] [address]  Phone: [phone]  

## 2021-05-16 NOTE — Progress Notes (Signed)
BP (!) 87/59   Pulse 85   Temp 98.2 F (36.8 C)   Ht 5' 5.5" (1.664 m)   Wt 171 lb (77.6 kg)   SpO2 95%   BMI 28.02 kg/m    Subjective:    Patient ID: Desiree Chavez, female    DOB: Apr 01, 1981, 40 y.o.   MRN: [id number]  HPI: Desiree Chavez is a 40 y.o. female presenting on 05/16/2021 for comprehensive medical examination. Current medical complaints include:  Dealing with her MS. Feeling OK. Working with the neurologist.   RASH Duration:  weeks  Location: arms  Itching: yes Burning: yes Redness: yes Oozing: no Scaling: yes Blisters: yes Painful: no Fevers: no Change in detergents/soaps/personal care products: no Recent illness: no Recent travel:no History of same: no Context: stable Alleviating factors: nothing Treatments attempted:lotion/moisturizer Shortness of breath: no  Throat/tongue swelling: no Myalgias/arthralgias: no  She currently lives with: son and parents Menopausal Symptoms: no  Depression Screen done today and results listed below:  Depression screen [facility] 2/9 05/16/2021 11/13/2019 03/23/2018 02/18/2018  Decreased Interest 0 0 0 0  Down, Depressed, Hopeless 0 0 0 0  PHQ - 2 Score 0 0 0 0  Altered sleeping 0 - 0 0  Tired, decreased energy 0 - 0 0  Change in appetite 0 - 0 0  Feeling bad or failure about yourself  0 - 0 0  Trouble concentrating 0 - 0 0  Moving slowly or fidgety/restless 0 - 0 0  Suicidal thoughts 0 - 0 0  PHQ-9 Score 0 - 0 0  Difficult doing work/chores Not difficult at all - - -    Past Medical History:  Past Medical History:  Diagnosis Date   Endometriosis     Surgical History:  Past Surgical History:  Procedure Laterality Date   LAPAROSCOPIC ENDOMETRIOSIS FULGURATION N/A     Medications:  Current Outpatient Medications on File Prior to Visit  Medication Sig   cyanocobalamin 1000 MCG tablet Take by mouth.   fexofenadine (ALLEGRA) 180 MG tablet Take 180 mg by mouth daily.   fluticasone (FLONASE) 50 MCG/ACT  nasal spray Place 2 sprays into both nostrils 2 (two) times daily.   gabapentin (NEURONTIN) 300 MG capsule Take 300 mg by mouth 2 (two) times daily.   nortriptyline (PAMELOR) 10 MG capsule 20mg  in the AM and 50mg  at night   ondansetron (ZOFRAN-ODT) 4 MG disintegrating tablet Take 4 mg by mouth every 8 (eight) hours as needed.   rizatriptan (MAXALT) 10 MG tablet TAKE 1 TABLET BY MOUTH AT ONSET OF HEADACHE, CAN REPEAT ONCE AFTER 2 HOURS IF NEEDED. NO MORE THAN 2 TABLETS IN 24 HOURS   No current facility-administered medications on file prior to visit.    Allergies:  Allergies  Allergen Reactions   Codeine Rash    Social History:  Social History   Socioeconomic History   Marital status: Single    Spouse name: Not on file   Number of children: Not on file   Years of education: Not on file   Highest education level: Not on file  Occupational History   Not on file  Tobacco Use   Smoking status: Never   Smokeless tobacco: Never  Vaping Use   Vaping Use: Never used  Substance and Sexual Activity   Alcohol use: Never   Drug use: Never   Sexual activity: Not Currently  Other Topics Concern   Not on file  Social History Narrative   Not on file  Social Determinants of Health   Financial Resource Strain: Not on file  Food Insecurity: Not on file  Transportation Needs: Not on file  Physical Activity: Not on file  Stress: Not on file  Social Connections: Not on file  Intimate Partner Violence: Not on file   Social History   Tobacco Use  Smoking Status Never  Smokeless Tobacco Never   Social History   Substance and Sexual Activity  Alcohol Use Never    Family History:  Family History  Problem Relation Age of Onset   Diabetes Mother    Hypertension Mother    Hyperlipidemia Mother    Depression Mother    Hypertension Father    Hyperlipidemia Father    Depression Father    Stroke Father    Bipolar disorder Father    Bartter's syndrome Sister    Heart disease  Maternal Grandmother    COPD Maternal Grandmother    Diabetes Maternal Grandmother    Cancer Maternal Grandfather    Multiple sclerosis Maternal Aunt     Past medical history, surgical history, medications, allergies, family history and social history reviewed with patient today and changes made to appropriate areas of the chart.   Review of Systems  Constitutional: Negative.   HENT:  Positive for congestion. Negative for ear discharge, ear pain, hearing loss, nosebleeds, sinus pain, sore throat and tinnitus.   Eyes: Negative.   Respiratory: Negative.  Negative for stridor.   Cardiovascular: Negative.   Gastrointestinal: Negative.   Genitourinary: Negative.   Musculoskeletal: Negative.   Skin: Negative.   Neurological:  Positive for dizziness, weakness and headaches. Negative for tingling, tremors, sensory change, speech change, focal weakness, seizures and loss of consciousness.  Endo/Heme/Allergies:  Positive for environmental allergies. Negative for polydipsia. Does not bruise/bleed easily.  Psychiatric/Behavioral: Negative.    All other ROS negative except what is listed above and in the HPI.      Objective:    BP (!) 87/59   Pulse 85   Temp 98.2 F (36.8 C)   Ht 5' 5.5" (1.664 m)   Wt 171 lb (77.6 kg)   SpO2 95%   BMI 28.02 kg/m   Wt Readings from Last 3 Encounters:  05/16/21 171 lb (77.6 kg)  11/16/18 150 lb (68 kg)  08/30/18 146 lb 14.4 oz (66.6 kg)    Physical Exam Vitals and nursing note reviewed.  Constitutional:      General: She is not in acute distress.    Appearance: Normal appearance. She is not ill-appearing, toxic-appearing or diaphoretic.  HENT:     Head: Normocephalic and atraumatic.     Right Ear: Tympanic membrane, ear canal and external ear normal. There is no impacted cerumen.     Left Ear: Tympanic membrane, ear canal and external ear normal. There is no impacted cerumen.     Nose: Nose normal. No congestion or rhinorrhea.     Mouth/Throat:      Mouth: Mucous membranes are moist.     Pharynx: Oropharynx is clear. No oropharyngeal exudate or posterior oropharyngeal erythema.  Eyes:     General: No scleral icterus.       Right eye: No discharge.        Left eye: No discharge.     Extraocular Movements: Extraocular movements intact.     Conjunctiva/sclera: Conjunctivae normal.     Pupils: Pupils are equal, round, and reactive to light.  Neck:     Vascular: No carotid bruit.  Cardiovascular:  Rate and Rhythm: Normal rate and regular rhythm.     Pulses: Normal pulses.     Heart sounds: No murmur heard.   No friction rub. No gallop.  Pulmonary:     Effort: Pulmonary effort is normal. No respiratory distress.     Breath sounds: Normal breath sounds. No stridor. No wheezing, rhonchi or rales.  Chest:     Chest wall: No tenderness.  Abdominal:     General: Abdomen is flat. Bowel sounds are normal. There is no distension.     Palpations: Abdomen is soft. There is no mass.     Tenderness: There is no abdominal tenderness. There is no right CVA tenderness, left CVA tenderness, guarding or rebound.     Hernia: No hernia is present.  Genitourinary:    Comments: Breast and pelvic exams deferred with shared decision making Musculoskeletal:        General: No swelling, tenderness, deformity or signs of injury.     Cervical back: Normal range of motion and neck supple. No rigidity. No muscular tenderness.     Right lower leg: No edema.     Left lower leg: No edema.  Lymphadenopathy:     Cervical: No cervical adenopathy.  Skin:    General: Skin is warm and dry.     Capillary Refill: Capillary refill takes less than 2 seconds.     Coloration: Skin is not jaundiced or pale.     Findings: Rash present. No bruising, erythema or lesion.  Neurological:     General: No focal deficit present.     Mental Status: She is alert and oriented to person, place, and time. Mental status is at baseline.     Cranial Nerves: No cranial nerve  deficit.     Sensory: No sensory deficit.     Motor: No weakness.     Coordination: Coordination normal.     Gait: Gait normal.     Deep Tendon Reflexes: Reflexes normal.  Psychiatric:        Mood and Affect: Mood normal.        Behavior: Behavior normal.        Thought Content: Thought content normal.        Judgment: Judgment normal.    Results for orders placed or performed in visit on 11/13/19  Microscopic Examination   URINE  Result Value Ref Range   WBC, UA >30 (A) 0 - 5 /hpf   RBC 11-30 (A) 0 - 2 /hpf   Epithelial Cells (non renal) 0-10 0 - 10 /hpf   Bacteria, UA Moderate (A) None seen/Few  Urine Culture, Reflex   URINE  Result Value Ref Range   Urine Culture, Routine Final report (A)    Organism ID, Bacteria Comment (A)    Antimicrobial Susceptibility Comment   UA/M w/rflx Culture, Routine   Specimen: Urine   URINE  Result Value Ref Range   Specific Gravity, UA 1.020 1.005 - 1.030   pH, UA 6.0 5.0 - 7.5   Color, UA Yellow Yellow   Appearance Ur Cloudy (A) Clear   Leukocytes,UA 2+ (A) Negative   Protein,UA 2+ (A) Negative/Trace   Glucose, UA Negative Negative   Ketones, UA Negative Negative   RBC, UA 2+ (A) Negative   Bilirubin, UA Negative Negative   Urobilinogen, Ur 0.2 0.2 - 1.0 mg/dL   Nitrite, UA Positive (A) Negative   Microscopic Examination See below:    Urinalysis Reflex Comment       Assessment &  Plan:   Problem List Items Addressed This Visit   None Visit Diagnoses     Routine general medical examination at a health care facility    -  Primary   Vaccines up to date. Screening labs checked today. Pap up to date. Mammogram ordered. Continue diet and exercise. Call with any concerns.    Relevant Orders   CBC with Differential/Platelet   Comprehensive metabolic panel   Lipid Panel w/o Chol/HDL Ratio   Urinalysis, Routine w reflex microscopic   TSH   Hepatitis C Antibody   Contact dermatitis, unspecified contact dermatitis type, unspecified  trigger       Will treat with trimacinalone ointment. Call if not getting better or getting worse.    Encounter for screening mammogram for malignant neoplasm of breast       Mammogram ordered today.   Relevant Orders   MM 3D SCREEN BREAST BILATERAL   Need for influenza vaccination       Flu shot given today.   Relevant Orders   Flu Vaccine QUAD 6+ mos PF IM (Fluarix Quad PF)        Follow up plan: Return As able for skin biopsy.   LABORATORY TESTING:  - Pap smear: up to date  IMMUNIZATIONS:   - Tdap: Tetanus vaccination status reviewed: Tdap vaccination indicated and given today. - Influenza: Administered today - Pneumovax: Not applicable - COVID: Up to date  SCREENING: -Mammogram: Ordered today   PATIENT COUNSELING:   Advised to take 1 mg of folate supplement per day if capable of pregnancy.   Sexuality: Discussed sexually transmitted diseases, partner selection, use of condoms, avoidance of unintended pregnancy  and contraceptive alternatives.   Advised to avoid cigarette smoking.  I discussed with the patient that most people either abstain from alcohol or drink within safe limits (<=14/week and <=4 drinks/occasion for males, <=7/weeks and <= 3 drinks/occasion for females) and that the risk for alcohol disorders and other health effects rises proportionally with the number of drinks per week and how often a drinker exceeds daily limits.  Discussed cessation/primary prevention of drug use and availability of treatment for abuse.   Diet: Encouraged to adjust caloric intake to maintain  or achieve ideal body weight, to reduce intake of dietary saturated fat and total fat, to limit sodium intake by avoiding high sodium foods and not adding table salt, and to maintain adequate dietary potassium and calcium preferably from fresh fruits, vegetables, and low-fat dairy products.    stressed the importance of regular exercise  Injury prevention: Discussed safety belts, safety  helmets, smoke detector, smoking near bedding or upholstery.   Dental health: Discussed importance of regular tooth brushing, flossing, and dental visits.    NEXT PREVENTATIVE PHYSICAL DUE IN 1 YEAR. Return As able for skin biopsy.

## 2021-05-17 LAB — CBC WITH DIFFERENTIAL/PLATELET
Basophils Absolute: 0.1 10*3/uL (ref 0.0–0.2)
Basos: 1 %
EOS (ABSOLUTE): 0.3 10*3/uL (ref 0.0–0.4)
Eos: 4 %
Hematocrit: 38 % (ref 34.0–46.6)
Hemoglobin: 12.4 g/dL (ref 11.1–15.9)
Immature Grans (Abs): 0 10*3/uL (ref 0.0–0.1)
Immature Granulocytes: 0 %
Lymphocytes Absolute: 1.6 10*3/uL (ref 0.7–3.1)
Lymphs: 25 %
MCH: 28.8 pg (ref 26.6–33.0)
MCHC: 32.6 g/dL (ref 31.5–35.7)
MCV: 88 fL (ref 79–97)
Monocytes Absolute: 0.7 10*3/uL (ref 0.1–0.9)
Monocytes: 11 %
Neutrophils Absolute: 3.7 10*3/uL (ref 1.4–7.0)
Neutrophils: 59 %
Platelets: 301 10*3/uL (ref 150–450)
RBC: 4.3 x10E6/uL (ref 3.77–5.28)
RDW: 12.5 % (ref 11.7–15.4)
WBC: 6.2 10*3/uL (ref 3.4–10.8)

## 2021-05-17 LAB — COMPREHENSIVE METABOLIC PANEL
ALT: 16 IU/L (ref 0–32)
AST: 18 IU/L (ref 0–40)
Albumin/Globulin Ratio: 1.9 (ref 1.2–2.2)
Albumin: 4.6 g/dL (ref 3.8–4.8)
Alkaline Phosphatase: 63 IU/L (ref 44–121)
BUN/Creatinine Ratio: 10 (ref 9–23)
BUN: 7 mg/dL (ref 6–24)
Bilirubin Total: 0.4 mg/dL (ref 0.0–1.2)
CO2: 25 mmol/L (ref 20–29)
Calcium: 9.3 mg/dL (ref 8.7–10.2)
Chloride: 101 mmol/L (ref 96–106)
Creatinine, Ser: 0.71 mg/dL (ref 0.57–1.00)
Globulin, Total: 2.4 g/dL (ref 1.5–4.5)
Glucose: 63 mg/dL — ABNORMAL LOW (ref 70–99)
Potassium: 3.5 mmol/L (ref 3.5–5.2)
Sodium: 140 mmol/L (ref 134–144)
Total Protein: 7 g/dL (ref 6.0–8.5)
eGFR: 109 mL/min/{1.73_m2} (ref >59)

## 2021-05-17 LAB — LIPID PANEL W/O CHOL/HDL RATIO
Cholesterol, Total: 200 mg/dL — ABNORMAL HIGH (ref 100–199)
HDL: 50 mg/dL (ref >39)
LDL Chol Calc (NIH): 123 mg/dL — ABNORMAL HIGH (ref 0–99)
Triglycerides: 151 mg/dL — ABNORMAL HIGH (ref 0–149)
VLDL Cholesterol Cal: 27 mg/dL (ref 5–40)

## 2021-05-17 LAB — HEPATITIS C ANTIBODY: Hep C Virus Ab: <0.1 s/co ratio (ref 0.0–0.9)

## 2021-05-17 LAB — TSH: TSH: 0.931 u[IU]/mL (ref 0.450–4.500)

## 2021-05-22 ENCOUNTER — Other Ambulatory Visit: Payer: Self-pay

## 2021-05-22 ENCOUNTER — Encounter: Payer: Self-pay | Admitting: Family Medicine

## 2021-05-22 ENCOUNTER — Other Ambulatory Visit (HOSPITAL_COMMUNITY)
Admission: RE | Admit: 2021-05-22 | Discharge: 2021-05-22 | Disposition: A | Discharge status: Home / Self Care | Payer: BC Managed Care – PPO | Source: Ambulatory Visit | Attending: Family Medicine | Admitting: Family Medicine

## 2021-05-22 ENCOUNTER — Ambulatory Visit: Payer: BC Managed Care – PPO | Admitting: Family Medicine

## 2021-05-22 VITALS — BP 103/71 | HR 106

## 2021-05-22 DIAGNOSIS — L821 Other seborrheic keratosis: Secondary | ICD-10-CM | POA: Diagnosis not present

## 2021-05-22 DIAGNOSIS — D485 Neoplasm of uncertain behavior of skin: Secondary | ICD-10-CM | POA: Diagnosis not present

## 2021-05-22 NOTE — Progress Notes (Signed)
Skin Procedure  Procedure: Informed consent given.  Sterile prep of the area.  Area infiltrated with lidocaine without epinephrine.  Using a surgical blade, part of the upper dermis shaved off and sent  for pathology.  Area cauterized. Pt ed on scarring.  Diagnosis:   ICD-10-CM   1. Neoplasm of uncertain behavior of skin  D48.5 Surgical pathology      Lesion Location/Size: 1 inch hyperpigmented stuck on lesion on R shoulder Physician: MJ Consent:  Risks, benefits, and alternative treatments discussed and all questions were answered.  Patient elected to proceed and verbal consent obtained.  Description: Area prepped and draped using semi-sterile technique. Area locally anesthetized using 3 cc's of lidocaine 1% plain.  Shave biopsy of lesion performed using a dermablade.  Adequate hemostastis achieved using Silver Nitrate. Wound dressed after application of bacitracin ointment.  Post Procedure Instructions: Wound care instructions discussed and patient was instructed to keep area clean and dry.  Signs and symptoms of infection discussed, patient agrees to contact the office ASAP should they occur.  Dressing change recommended every other day. Signs and symptoms of infection discussed and patient encouraged to contact the clinic ASAP for concerns.   Follow Up: Return if symptoms worsen or fail to improve.

## 2021-05-26 LAB — SURGICAL PATHOLOGY

## 2021-06-20 DIAGNOSIS — G35 Multiple sclerosis: Secondary | ICD-10-CM | POA: Diagnosis not present

## 2021-06-24 DIAGNOSIS — R29818 Other symptoms and signs involving the nervous system: Secondary | ICD-10-CM | POA: Diagnosis not present

## 2021-06-24 DIAGNOSIS — R519 Headache, unspecified: Secondary | ICD-10-CM | POA: Diagnosis not present

## 2021-06-24 DIAGNOSIS — G35 Multiple sclerosis: Secondary | ICD-10-CM | POA: Diagnosis not present

## 2021-06-24 DIAGNOSIS — R29898 Other symptoms and signs involving the musculoskeletal system: Secondary | ICD-10-CM | POA: Diagnosis not present

## 2021-10-29 ENCOUNTER — Other Ambulatory Visit: Payer: Self-pay | Admitting: Neurology

## 2021-10-29 DIAGNOSIS — G35 Multiple sclerosis: Secondary | ICD-10-CM

## 2021-11-17 ENCOUNTER — Ambulatory Visit
Admission: RE | Admit: 2021-11-17 | Discharge: 2021-11-17 | Disposition: A | Discharge status: Home / Self Care | Payer: Self-pay | Source: Ambulatory Visit | Attending: Neurology | Admitting: Neurology

## 2021-11-17 DIAGNOSIS — G35 Multiple sclerosis: Secondary | ICD-10-CM

## 2021-11-17 MED ORDER — GADOBUTROL 1 MMOL/ML IV SOLN
7.0000 mL | Freq: Once | INTRAVENOUS | Status: AC | PRN
  Administered 2021-11-17: 7 mL via INTRAVENOUS

## 2021-12-04 ENCOUNTER — Ambulatory Visit: Payer: Commercial Managed Care - HMO | Admitting: Family Medicine

## 2021-12-04 ENCOUNTER — Encounter: Payer: Self-pay | Admitting: Family Medicine

## 2021-12-04 VITALS — BP 96/66 | HR 76 | Temp 97.4°F | Wt 176.8 lb

## 2021-12-04 DIAGNOSIS — J029 Acute pharyngitis, unspecified: Secondary | ICD-10-CM | POA: Diagnosis not present

## 2021-12-04 MED ORDER — PREDNISONE 50 MG PO TABS: 50.0000 mg | ORAL_TABLET | Freq: Every day | ORAL | 0 refills | Status: DC

## 2021-12-04 MED ORDER — AMOXICILLIN-POT CLAVULANATE 875-125 MG PO TABS: 1.0000 | ORAL_TABLET | Freq: Two times a day (BID) | ORAL | 0 refills | Status: DC

## 2021-12-04 NOTE — Progress Notes (Signed)
BP 96/66   Pulse 76   Temp (!) 97.4 F (36.3 C)   Wt 176 lb 12.8 oz (80.2 kg)   LMP 11/06/2021   SpO2 100%   BMI 28.97 kg/m    Subjective:    Patient ID: Desiree Chavez, female    DOB: Feb 03, 1981, 41 y.o.   MRN: [id number]  HPI: Desiree Chavez is a 41 y.o. female  Chief Complaint  Patient presents with   URI    Patient states Desiree Chavez lost her voice on Monday, then started feeling facial pressure, ear pain, and ST. Patient states Desiree Chavez is now coughing and has a lot of drainage.    UPPER RESPIRATORY TRACT INFECTION Duration: 1 week Worst symptom: congestion Fever: no Cough: yes Shortness of breath: no Wheezing: no Chest pain: no Chest tightness: no Chest congestion: no Nasal congestion: yes Runny nose: yes Post nasal drip: yes Sneezing: no Sore throat: yes Swollen glands: no Sinus pressure: yes Headache: yes Face pain: yes Toothache: yes Ear pain: yes  Ear pressure: yes  Eyes red/itching:yes Eye drainage/crusting: no  Vomiting: no Rash: no Fatigue: yes Sick contacts: no Strep contacts: no  Context: worse Recurrent sinusitis: no Relief with OTC cold/cough medications: no  Treatments attempted: sudafed   Relevant past medical, surgical, family and social history reviewed and updated as indicated. Interim medical history since our last visit reviewed. Allergies and medications reviewed and updated.  Review of Systems  Constitutional:  Positive for chills, diaphoresis and fatigue. Negative for activity change, appetite change, fever and unexpected weight change.  HENT:  Positive for congestion, postnasal drip, rhinorrhea, sinus pressure, sinus pain and sore throat. Negative for dental problem, drooling, ear discharge, ear pain, facial swelling, hearing loss, mouth sores, nosebleeds, sneezing, tinnitus, trouble swallowing and voice change.   Respiratory:  Positive for cough. Negative for apnea, choking, chest tightness, shortness of breath, wheezing and  stridor.   Cardiovascular: Negative.   Gastrointestinal: Negative.   Musculoskeletal: Negative.   Psychiatric/Behavioral: Negative.     Per HPI unless specifically indicated above     Objective:    BP 96/66   Pulse 76   Temp (!) 97.4 F (36.3 C)   Wt 176 lb 12.8 oz (80.2 kg)   LMP 11/06/2021   SpO2 100%   BMI 28.97 kg/m   Wt Readings from Last 3 Encounters:  12/04/21 176 lb 12.8 oz (80.2 kg)  05/16/21 171 lb (77.6 kg)  11/16/18 150 lb (68 kg)    Physical Exam Vitals and nursing note reviewed.  Constitutional:      General: Desiree Chavez is not in acute distress.    Appearance: Normal appearance. Desiree Chavez is not ill-appearing, toxic-appearing or diaphoretic.  HENT:     Head: Normocephalic and atraumatic.     Right Ear: Tympanic membrane, ear canal and external ear normal. There is no impacted cerumen.     Left Ear: Tympanic membrane, ear canal and external ear normal. There is no impacted cerumen.     Nose: Congestion and rhinorrhea present.     Mouth/Throat:     Mouth: Mucous membranes are moist.     Pharynx: Oropharynx is clear. Posterior oropharyngeal erythema present. No oropharyngeal exudate.  Eyes:     General: No scleral icterus.       Right eye: No discharge.        Left eye: No discharge.     Extraocular Movements: Extraocular movements intact.     Conjunctiva/sclera: Conjunctivae normal.     Pupils:  Pupils are equal, round, and reactive to light.  Neck:     Vascular: No carotid bruit.  Cardiovascular:     Rate and Rhythm: Normal rate and regular rhythm.     Pulses: Normal pulses.     Heart sounds: Normal heart sounds. No murmur heard.   No friction rub. No gallop.  Pulmonary:     Effort: Pulmonary effort is normal. No respiratory distress.     Breath sounds: Normal breath sounds. No stridor. No wheezing, rhonchi or rales.  Chest:     Chest wall: No tenderness.  Musculoskeletal:        General: Normal range of motion.     Cervical back: Normal range of motion and  neck supple. No rigidity or tenderness.  Lymphadenopathy:     Cervical: Cervical adenopathy present.  Skin:    General: Skin is warm and dry.     Capillary Refill: Capillary refill takes less than 2 seconds.     Coloration: Skin is not jaundiced or pale.     Findings: No bruising, erythema, lesion or rash.  Neurological:     General: No focal deficit present.     Mental Status: Desiree Chavez is alert and oriented to person, place, and time. Mental status is at baseline.  Psychiatric:        Mood and Affect: Mood normal.        Behavior: Behavior normal.        Thought Content: Thought content normal.        Judgment: Judgment normal.    Results for orders placed or performed in visit on 05/22/21  Surgical pathology  Result Value Ref Range   SURGICAL PATHOLOGY      SURGICAL PATHOLOGY CASE: [id number] PATIENT: Desiree Chavez Surgical Pathology Report     Clinical History: 1 inch hyperpigmented stuck on lesion on right shoulder with areas of darker skin within (cm)     FINAL MICROSCOPIC DIAGNOSIS:  A. SKIN, RIGHT SHOULDER, EXCISION: Papillary seborrheic keratosis. Negative for malignancy.   GROSS DESCRIPTION:  A.  Received in formalin labeled with the patients name and DOB is an unoriented 1.1 x 1.0 cm light tan skin shave with a roughened, indurated surface.  The specimen is inked black, sectioned, and entirely submitted in a single cassette.  (LEF 05/23/2021)    Final Diagnosis performed by Unknown Jim, MD.   Electronically signed 05/26/2021 Technical and / or Professional components performed at [facility]. [facility], [place] [address], [place], [place] [address].  Immunohistochemistry Technical component (if applicable) was performed at [facility]. [address], [address], [place], [place] [address].   IMMUNOHISTOCHEMISTRY DISCLAIMER (if applicable): Some of these immunohistochemical stains may have been developed and  the performance characteristics determine by [facility]. Some may not have been cleared or approved by the U.[place]. Food and Drug Administration. The FDA has determined that such clearance or approval is not necessary. This test is used for clinical purposes. It should not be regarded as investigational or for research. This laboratory is certified under the [place] (CLIA-88) as qualified to perform high complexity clinical laboratory testing.  The controls stained appropriately.       Assessment & Plan:   Problem List Items Addressed This Visit   None Visit Diagnoses     Sore throat    -  Primary   Relevant Orders   Novel Coronavirus, NAA ([facility])   Rapid Strep screen([facility])  Follow up plan: No follow-ups on file.

## 2021-12-06 LAB — NOVEL CORONAVIRUS, NAA: SARS-CoV-2, NAA: NOT DETECTED

## 2021-12-07 LAB — CULTURE, GROUP A STREP: Strep A Culture: NEGATIVE

## 2021-12-07 LAB — RAPID STREP SCREEN (MED CTR MEBANE ONLY): Strep Gp A Ag, IA W/Reflex: NEGATIVE

## 2022-04-15 ENCOUNTER — Encounter: Payer: Self-pay | Admitting: Family Medicine

## 2022-04-15 ENCOUNTER — Ambulatory Visit: Payer: Commercial Managed Care - HMO | Admitting: Family Medicine

## 2022-04-15 VITALS — BP 94/65 | HR 96 | Temp 98.0°F | Ht 65.51 in | Wt 172.6 lb

## 2022-04-15 DIAGNOSIS — J069 Acute upper respiratory infection, unspecified: Secondary | ICD-10-CM

## 2022-04-15 MED ORDER — PREDNISONE 50 MG PO TABS: 50.0000 mg | ORAL_TABLET | Freq: Every day | ORAL | 0 refills | Status: DC

## 2022-04-15 MED ORDER — HYDROCOD POLI-CHLORPHE POLI ER 10-8 MG/5ML PO SUER: 5.0000 mL | Freq: Two times a day (BID) | ORAL | 0 refills | Status: DC | PRN

## 2022-04-15 NOTE — Progress Notes (Signed)
BP 94/65   Pulse 96   Temp 98 F (36.7 C) (Oral)   Ht 5' 5.51" (1.664 m)   Wt 172 lb 9.6 oz (78.3 kg)   SpO2 99%   BMI 28.27 kg/m    Subjective:    Patient ID: Desiree Chavez, female    DOB: 17-Mar-1981, 41 y.o.   MRN: [id number]  HPI: Desiree Chavez is a 41 y.o. female  Chief Complaint  Patient presents with   URI   UPPER RESPIRATORY TRACT INFECTION Duration: 4 days Worst symptom: head congestion Fever: no Cough: no Shortness of breath: no Wheezing: no Chest pain: no Chest tightness: no Chest congestion: no Nasal congestion: yes Runny nose: yes Post nasal drip: yes Sneezing: no Sore throat: yes Swollen glands: no Sinus pressure: yes Headache: yes Face pain: yes Toothache: yes Ear pain: yes  Ear pressure: yes  Eyes red/itching:no Eye drainage/crusting: no  Vomiting: no Rash: no Fatigue: yes Sick contacts: yes Strep contacts: no  Context: worse Recurrent sinusitis: no Relief with OTC cold/cough medications: no  Treatments attempted: sudafed   Relevant past medical, surgical, family and social history reviewed and updated as indicated. Interim medical history since our last visit reviewed. Allergies and medications reviewed and updated.  Review of Systems  Constitutional:  Positive for fatigue. Negative for activity change, appetite change, chills, diaphoresis, fever and unexpected weight change.  HENT:  Positive for congestion, facial swelling, postnasal drip, sinus pressure and sinus pain. Negative for dental problem, drooling, ear discharge, ear pain, hearing loss, mouth sores, nosebleeds, rhinorrhea, sneezing, sore throat, tinnitus, trouble swallowing and voice change.   Eyes: Negative.   Respiratory: Negative.    Cardiovascular: Negative.   Psychiatric/Behavioral: Negative.      Per HPI unless specifically indicated above     Objective:    BP 94/65   Pulse 96   Temp 98 F (36.7 C) (Oral)   Ht 5' 5.51" (1.664 m)   Wt 172 lb 9.6  oz (78.3 kg)   SpO2 99%   BMI 28.27 kg/m   Wt Readings from Last 3 Encounters:  04/15/22 172 lb 9.6 oz (78.3 kg)  12/04/21 176 lb 12.8 oz (80.2 kg)  05/16/21 171 lb (77.6 kg)    Physical Exam Vitals and nursing note reviewed.  Constitutional:      General: She is not in acute distress.    Appearance: Normal appearance. She is normal weight. She is not ill-appearing, toxic-appearing or diaphoretic.  HENT:     Head: Normocephalic and atraumatic.     Right Ear: External ear normal.     Left Ear: External ear normal.     Nose: Nose normal.     Mouth/Throat:     Mouth: Mucous membranes are moist.     Pharynx: Oropharynx is clear.  Eyes:     General: No scleral icterus.       Right eye: No discharge.        Left eye: No discharge.     Extraocular Movements: Extraocular movements intact.     Conjunctiva/sclera: Conjunctivae normal.     Pupils: Pupils are equal, round, and reactive to light.  Cardiovascular:     Rate and Rhythm: Normal rate and regular rhythm.     Pulses: Normal pulses.     Heart sounds: Normal heart sounds. No murmur heard.    No friction rub. No gallop.  Pulmonary:     Effort: Pulmonary effort is normal. No respiratory distress.  Breath sounds: Normal breath sounds. No stridor. No wheezing, rhonchi or rales.  Chest:     Chest wall: No tenderness.  Musculoskeletal:        General: Normal range of motion.     Cervical back: Normal range of motion and neck supple.  Skin:    General: Skin is warm and dry.     Capillary Refill: Capillary refill takes less than 2 seconds.     Coloration: Skin is not jaundiced or pale.     Findings: No bruising, erythema, lesion or rash.  Neurological:     General: No focal deficit present.     Mental Status: She is alert and oriented to person, place, and time. Mental status is at baseline.  Psychiatric:        Mood and Affect: Mood normal.        Behavior: Behavior normal.        Thought Content: Thought content normal.         Judgment: Judgment normal.     Results for orders placed or performed in visit on 12/04/21  Novel Coronavirus, NAA ([facility])   Specimen: Nasopharyngeal(NP) swabs in vial transport medium  Result Value Ref Range   SARS-CoV-2, NAA Not Detected Not Detected  Rapid Strep screen([facility])   Specimen: Other   Other  Result Value Ref Range   Strep Gp A Ag, IA W/Reflex Negative Negative  Culture, Group A Strep   Other  Result Value Ref Range   Strep A Culture Negative       Assessment & Plan:   Problem List Items Addressed This Visit   None Visit Diagnoses     Upper respiratory tract infection, unspecified type    -  Primary   Strep and flu negative. Await COVID. Will check at home and call if +. Start prednisone and tussionex for comfort. Call with any concerns.    Relevant Orders   Veritor Flu A/B Waived   Rapid Strep Screen ([facility] ONLY)   Novel Coronavirus, NAA ([facility])        Follow up plan: Return if symptoms worsen or fail to improve.

## 2022-04-16 LAB — NOVEL CORONAVIRUS, NAA: SARS-CoV-2, NAA: NOT DETECTED

## 2022-04-18 LAB — VERITOR FLU A/B WAIVED
Influenza A: NEGATIVE
Influenza B: NEGATIVE

## 2022-04-18 LAB — CULTURE, GROUP A STREP: Strep A Culture: NEGATIVE

## 2022-04-18 LAB — RAPID STREP SCREEN (MED CTR MEBANE ONLY): Strep Gp A Ag, IA W/Reflex: NEGATIVE

## 2022-08-31 ENCOUNTER — Telehealth: Payer: Commercial Managed Care - HMO | Admitting: Family Medicine

## 2022-08-31 ENCOUNTER — Ambulatory Visit: Payer: Self-pay

## 2022-08-31 DIAGNOSIS — R112 Nausea with vomiting, unspecified: Secondary | ICD-10-CM | POA: Diagnosis not present

## 2022-08-31 NOTE — Patient Instructions (Signed)
Desiree Chavez, thank you for joining Perlie Mayo, NP for today's virtual visit.  While this provider is not your primary care provider (PCP), if your PCP is located in our provider database this encounter information will be shared with them immediately following your visit.   [facility] account gives you access to today's visit and all your visits, tests, and labs performed at [facility] " click here if you don't have a [facility] account or go to [email or web address]  Consent: (Patient) Desiree Chavez provided verbal consent for this virtual visit at the beginning of the encounter.  Current Medications:  Current Outpatient Medications:    chlorpheniramine-HYDROcodone (TUSSIONEX) 10-8 MG/5ML, Take 5 mLs by mouth every 12 (twelve) hours as needed for cough., Disp: 70 mL, Rfl: 0   cyanocobalamin 1000 MCG tablet, Take by mouth., Disp: , Rfl:    fexofenadine (ALLEGRA) 180 MG tablet, Take 180 mg by mouth daily., Disp: , Rfl:    fluticasone (FLONASE) 50 MCG/ACT nasal spray, Place 2 sprays into both nostrils 2 (two) times daily. (Patient not taking: Reported on 04/15/2022), Disp: 16 g, Rfl: 0   gabapentin (NEURONTIN) 300 MG capsule, Take 300 mg by mouth 2 (two) times daily., Disp: , Rfl:    nortriptyline (PAMELOR) 10 MG capsule, 72m in the AM and 569mat night, Disp: , Rfl:    ondansetron (ZOFRAN-ODT) 4 MG disintegrating tablet, Take 4 mg by mouth every 8 (eight) hours as needed., Disp: , Rfl:    predniSONE (DELTASONE) 50 MG tablet, Take 1 tablet (50 mg total) by mouth daily with breakfast., Disp: 5 tablet, Rfl: 0   rizatriptan (MAXALT) 10 MG tablet, TAKE 1 TABLET BY MOUTH AT ONSET OF HEADACHE, CAN REPEAT ONCE AFTER 2 HOURS IF NEEDED. NO MORE THAN 2 TABLETS IN 24 HOURS, Disp: , Rfl:    Medications ordered in this encounter:  No orders of the defined types were placed in this encounter.    *If you need refills on other medications prior to your  next appointment, please contact your pharmacy*  Follow-Up: Call back or seek an in-person evaluation if the symptoms worsen or if the condition fails to improve as anticipated.  [phone] Instructions Bl[place]iss Ros[place]iet A bland diet may consist of soft foods or foods that are not high in fat or are not greasy, acidic, or spicy. Avoiding certain foods may cause less irritation to your mouth, throat, stomach, or gastrointestinal tract. Avoiding certain foods may make you feel better. Everyone's tolerances are different. A bland diet should be based on what you can tolerate and what may cause discomfort. What is my plan? Your health care provider or dietitian may recommend specific changes to your diet to treat your symptoms. These changes may include: Eating small meals frequently. Cooking food until it is soft enough to chew easily. Taking the time to chew your food thoroughly, so it is easy to swallow and digest. Avoiding foods that cause you discomfort. These may include spicy food, fried food, greasy foods, hard-to-chew foods, or citrus fruits and juices. Drinking slowly. What are tips for following this plan? Reading food labels To reduce fiber intake, look for food labels that say "whole," such as whole wheat or whole grain. Shopping Avoid food items that may have nuts or seeds. Avoid vegetables that may make you gassy or have a tough texture, such as broccoli, cauliflower, or corn. Cooking Cook foods thoroughly so they have a  soft texture. Meal planning Make sure you include foods from all food groups to eat a balanced diet. Eat a variety of types of foods. Eat foods and drink beverages that do not cause you discomfort. These may include soups and broths with cooked meats, pasta, and vegetables. Lifestyle Sit up after meals, avoid tight clothing, and take time to eat and chew your food slowly. Ask your health care provider whether you should take  dietary supplements. General information Mildly season your foods. Some seasonings, such as cayenne pepper, vinegar, or hot sauce, may cause irritation. The foods, beverages, or seasonings to avoid should be based on individual tolerance. What foods should I eat? Fruits Canned or cooked fruit such as peaches, pears, or applesauce. Bananas. Vegetables Well-cooked vegetables. Canned or cooked vegetables such as carrots, green beans, beets, or spinach. Mashed or boiled potatoes. Grains  Hot cereals, such as cream of wheat and processed oatmeal. Rice. Bread, crackers, pasta, or tortillas made from refined white flour. Meats and other proteins  Eggs. Creamy peanut butter or other nut butters. Lean, well-cooked tender meats, such as beef, pork, chicken, or fish. Dairy Low-fat dairy products such as milk, cottage cheese, or yogurt. Beverages  Water. Herbal tea. Apple juice. Fats and oils Mild salad dressings. Canola or olive oil. Sweets and desserts Low-fat pudding, custard, or ice cream. Fruit gelatin. The items listed above may not be a complete list of foods and beverages you can eat. Contact a dietitian for more information. What foods should I avoid? Fruits Citrus fruits, such as oranges and grapefruit. Fruits with a stringy texture. Fruits that have lots of seeds, such as kiwi or strawberries. Dried fruits. Vegetables Raw, uncooked vegetables. Salads. Grains Whole grain breads, muffins, and cereals. Meats and other proteins Tough, fibrous meats. Highly seasoned meat such as corned beef, smoked meats, or fish. Processed high-fat meats such as brats, hot dogs, or sausage. Dairy Full-fat dairy foods such as ice cream and cheese. Beverages Caffeinated drinks. Alcohol. Seasonings and condiments Strongly flavored seasonings or condiments. Hot sauce. Salsa. Other foods Spicy foods. Fried or greasy foods. Sour foods, such as pickled or fermented foods like sauerkraut. Foods high in  fiber. The items listed above may not be a complete list of foods and beverages you should avoid. Contact a dietitian for more information. Summary A bland diet should be based on individual tolerance. It may consist of foods that are soft textured and do not have a lot of fat, fiber, acid, or seasonings. A bland diet may be recommended because avoiding certain foods, beverages, or spices may make you feel better. This information is not intended to replace advice given to you by your health care provider. Make sure you discuss any questions you have with your health care provider. Document Revised: 05/19/2021 Document Reviewed: 05/19/2021 Elsevier Patient Education  [place].   If you have been instructed to have an in-person evaluation today at a local Urgent Care facility, please use the link below. It will take you to a list of all of our available [facility], including address, phone number and hours of operation. Please do not delay care.  [facility]  If you or a family member do not have a primary care provider, use the link below to schedule a visit and establish care. When you choose a [facility] primary care physician or advanced practice provider, you gain a long-term partner in health. Find a Primary Care Provider  Learn more about  [facility]'s in-office and virtual care options: [facility]

## 2022-08-31 NOTE — Telephone Encounter (Signed)
Message from Sharene Skeans sent at 08/31/2022  8:27 AM EST  Summary: Vomiting and nausea   Pt experienced vomiting Friday and last night and has been nauseous  all weekend /pt also mention being tired / please advise          Chief Complaint: nausea and vomiting Symptoms: fatigue, lightheaded no appetite runny nose Frequency: Friday  Pertinent Negatives: Patient denies certigo, vomiting blood or coffee grounds recent head injury Disposition: []$ ED /[]$ Urgent Care (no appt availability in office) / []$ Appointment(In office/virtual)/ [x]$  [facility]/ []$ Home Care/ []$ Refused Recommended Disposition /[]$ [facility]/ []$  Follow-up with PCP Additional Notes: no openings in office today  Reason for Disposition  [1] MILD or MODERATE vomiting AND [2] present > 48 hours (2 days) (Exception: Mild vomiting with associated diarrhea.)  Answer Assessment - Initial Assessment Questions 1. VOMITING SEVERITY: "How many times have you vomited in the past 24 hours?"     - MILD:  1 - 2 times/day    - MODERATE: 3 - 5 times/day, decreased oral intake without significant weight loss or symptoms of dehydration    - SEVERE: 6 or more times/day, vomits everything or nearly everything, with significant weight loss, symptoms of dehydration      mild 2. ONSET: "When did the vomiting begin?"      Friday am  3. FLUIDS: "What fluids or food have you vomited up today?" "Have you been able to keep any fluids down?"     None-  4. ABDOMEN PAIN: "Are your having any abdomen pain?" If Yes : "How bad is it and what does it feel like?" (e.g., crampy, dull, intermittent, constant)      no 5. DIARRHEA: "Is there any diarrhea?" If Yes, ask: "How many times today?"      no 6. CONTACTS: "Is there anyone else in the family with the same symptoms?"      no 7. CAUSE: "What do you think is causing your vomiting?"     Not sure if r/t MS or virus 8. HYDRATION STATUS: "Any signs of dehydration?" (e.g., dry mouth  [not only dry lips], too weak to stand) "When did you last urinate?"     Lightheaded, headache 9. OTHER SYMPTOMS: "Do you have any other symptoms?" (e.g., fever, headache, vertigo, vomiting blood or coffee grounds, recent head injury)     Nausea, fatigue, lightheaded, no appetite, runny nose,  10. PREGNANCY: "Is there any chance you are pregnant?" "When was your last menstrual period?"       N/a  Protocols used: Vomiting-A-AH

## 2022-08-31 NOTE — Progress Notes (Signed)
Virtual Visit Consent   Desiree Chavez, you are scheduled for a virtual visit with a [facility] provider today. Just as with appointments in the office, your consent must be obtained to participate. Your consent will be active for this visit and any virtual visit you may have with one of our providers in the next 365 days. If you have a MyChart account, a copy of this consent can be sent to you electronically.  As this is a virtual visit, video technology does not allow for your provider to perform a traditional examination. This may limit your provider's ability to fully assess your condition. If your provider identifies any concerns that need to be evaluated in person or the need to arrange testing (such as labs, EKG, etc.), we will make arrangements to do so. Although advances in technology are sophisticated, we cannot ensure that it will always work on either your end or our end. If the connection with a video visit is poor, the visit may have to be switched to a telephone visit. With either a video or telephone visit, we are not always able to ensure that we have a secure connection.  By engaging in this virtual visit, you consent to the provision of healthcare and authorize for your insurance to be billed (if applicable) for the services provided during this visit. Depending on your insurance coverage, you may receive a charge related to this service.  I need to obtain your verbal consent now. Are you willing to proceed with your visit today? Desiree Chavez has provided verbal consent on 08/31/2022 for a virtual visit (video or telephone). Perlie Mayo, NP  Date: 08/31/2022 10:30 AM  Virtual Visit via Video Note   I, Perlie Mayo, connected with  Desiree Chavez  ([id number]:[id number], 1980/08/05) on 08/31/22 at 10:30 AM EST by a video-enabled telemedicine application and verified that I am speaking with the correct person using two identifiers.  Location: Patient: Virtual Visit Location  Patient: Home Provider: Virtual Visit Location Provider: Home Office   I discussed the limitations of evaluation and management by telemedicine and the availability of in person appointments. The patient expressed understanding and agreed to proceed.    History of Present Illness: Desiree Chavez is a 42 y.o. who identifies as a female who was assigned female at birth, and is being seen today for nausea and vomiting. Two episodes of vomiting- once Friday and once yesterday. Mostly having nausea off and on. Making urine- keeping water down now- with Zofran use. Reports mild lightheadedness from not being able to eat much. Associated symptoms include fatigue, and runny nose as well. Reports feeling a little better since starting Zofran last night. Denies vertigo/dizziness, blood in emesis, chest pain, shortness of breath. Has not tested for COVID. NO known sick contacts.   Problems:  Patient Active Problem List   Diagnosis Date Noted   Multiple sclerosis ([place])    Slurred speech 08/19/2018   Allergic rhinitis 02/18/2018   Endometriosis 02/18/2018    Allergies:  Allergies  Allergen Reactions   Codeine Rash   Medications:  Current Outpatient Medications:    chlorpheniramine-HYDROcodone (TUSSIONEX) 10-8 MG/5ML, Take 5 mLs by mouth every 12 (twelve) hours as needed for cough., Disp: 70 mL, Rfl: 0   cyanocobalamin 1000 MCG tablet, Take by mouth., Disp: , Rfl:    fexofenadine (ALLEGRA) 180 MG tablet, Take 180 mg by mouth daily., Disp: , Rfl:    fluticasone (FLONASE) 50 MCG/ACT nasal spray, Place 2  sprays into both nostrils 2 (two) times daily. (Patient not taking: Reported on 04/15/2022), Disp: 16 g, Rfl: 0   gabapentin (NEURONTIN) 300 MG capsule, Take 300 mg by mouth 2 (two) times daily., Disp: , Rfl:    nortriptyline (PAMELOR) 10 MG capsule, 22m in the AM and 566mat night, Disp: , Rfl:    ondansetron (ZOFRAN-ODT) 4 MG disintegrating tablet, Take 4 mg by mouth every 8 (eight) hours as  needed., Disp: , Rfl:    predniSONE (DELTASONE) 50 MG tablet, Take 1 tablet (50 mg total) by mouth daily with breakfast., Disp: 5 tablet, Rfl: 0   rizatriptan (MAXALT) 10 MG tablet, TAKE 1 TABLET BY MOUTH AT ONSET OF HEADACHE, CAN REPEAT ONCE AFTER 2 HOURS IF NEEDED. NO MORE THAN 2 TABLETS IN 24 HOURS, Disp: , Rfl:   Observations/Objective: Patient is well-developed, well-nourished in no acute distress.  Resting comfortably  at home.  Head is normocephalic, atraumatic.  No labored breathing.  Speech is clear and coherent with logical content.  Patient is alert and oriented at baseline.    Assessment and Plan:  1. Nausea and vomiting, unspecified vomiting type  -bland diet -continue zofran as directed -follow up if sx worsen   Reviewed side effects, risks and benefits of medication.    Patient acknowledged agreement and understanding of the plan.   Past Medical, Surgical, Social History, Allergies, and Medications have been Reviewed.    Follow Up Instructions: I discussed the assessment and treatment plan with the patient. The patient was provided an opportunity to ask questions and all were answered. The patient agreed with the plan and demonstrated an understanding of the instructions.  A copy of instructions were sent to the patient via MyChart unless otherwise noted below.    The patient was advised to call back or seek an in-person evaluation if the symptoms worsen or if the condition fails to improve as anticipated.  Time:  I spent 10 minutes with the patient via telehealth technology discussing the above problems/concerns.    HaPerlie MayoNP

## 2023-06-03 ENCOUNTER — Other Ambulatory Visit: Payer: Self-pay | Admitting: Neurology

## 2023-06-03 DIAGNOSIS — G35 Multiple sclerosis: Secondary | ICD-10-CM

## 2023-06-03 DIAGNOSIS — R29818 Other symptoms and signs involving the nervous system: Secondary | ICD-10-CM

## 2023-07-14 DIAGNOSIS — Z419 Encounter for procedure for purposes other than remedying health state, unspecified: Secondary | ICD-10-CM | POA: Diagnosis not present

## 2023-07-25 DIAGNOSIS — Z419 Encounter for procedure for purposes other than remedying health state, unspecified: Secondary | ICD-10-CM | POA: Diagnosis not present

## 2023-08-05 ENCOUNTER — Telehealth: Payer: Self-pay | Admitting: Family Medicine

## 2023-08-05 NOTE — Telephone Encounter (Signed)
Copied from CRM [id number]. Topic: General - Inquiry >> Aug 04, 2023  3:51 PM Waldron Session H wrote: Reason for CRM: Called patient to confirm insurance for upcoming appointment or if she will be self pay. Okay for [facility] to put information in notes if patient calls back. >> Aug 05, 2023 10:54 AM Elmarie Shiley B wrote: patient has insurance but does not know the name and will bring insurance card to 08/06/2023 appt

## 2023-08-06 ENCOUNTER — Ambulatory Visit (INDEPENDENT_AMBULATORY_CARE_PROVIDER_SITE_OTHER): Payer: No Typology Code available for payment source | Admitting: Family Medicine

## 2023-08-06 ENCOUNTER — Other Ambulatory Visit (HOSPITAL_COMMUNITY)
Admission: RE | Admit: 2023-08-06 | Discharge: 2023-08-06 | Disposition: A | Discharge status: Home / Self Care | Payer: Medicaid Other | Source: Ambulatory Visit | Attending: Family Medicine | Admitting: Family Medicine

## 2023-08-06 VITALS — BP 93/66 | HR 80 | Temp 98.2°F | Ht 66.0 in | Wt 171.6 lb

## 2023-08-06 DIAGNOSIS — Z0000 Encounter for general adult medical examination without abnormal findings: Secondary | ICD-10-CM | POA: Insufficient documentation

## 2023-08-06 DIAGNOSIS — N809 Endometriosis, unspecified: Secondary | ICD-10-CM | POA: Diagnosis not present

## 2023-08-06 DIAGNOSIS — N3001 Acute cystitis with hematuria: Secondary | ICD-10-CM

## 2023-08-06 DIAGNOSIS — G35 Multiple sclerosis: Secondary | ICD-10-CM

## 2023-08-06 DIAGNOSIS — R8290 Unspecified abnormal findings in urine: Secondary | ICD-10-CM

## 2023-08-06 DIAGNOSIS — L309 Dermatitis, unspecified: Secondary | ICD-10-CM

## 2023-08-06 LAB — MICROSCOPIC EXAMINATION: WBC, UA: >30 /[HPF] — AB (ref 0–5)

## 2023-08-06 LAB — URINALYSIS, ROUTINE W REFLEX MICROSCOPIC
Bilirubin, UA: NEGATIVE
Glucose, UA: NEGATIVE
Ketones, UA: NEGATIVE
Nitrite, UA: NEGATIVE
Specific Gravity, UA: 1.02 (ref 1.005–1.030)
Urobilinogen, Ur: 0.2 mg/dL (ref 0.2–1.0)
pH, UA: 7.5 (ref 5.0–7.5)

## 2023-08-06 MED ORDER — TRIAMCINOLONE ACETONIDE 0.5 % EX OINT: 1.0000 | TOPICAL_OINTMENT | Freq: Two times a day (BID) | CUTANEOUS | 2 refills | Status: AC

## 2023-08-06 NOTE — Assessment & Plan Note (Signed)
Will refer to GYN to discuss other options. Await their input.

## 2023-08-06 NOTE — Assessment & Plan Note (Signed)
Continue to follow with neurology. Call with any concerns.

## 2023-08-06 NOTE — Progress Notes (Signed)
BP 93/66   Pulse 80   Temp 98.2 F (36.8 C) (Oral)   Ht 5\' 6"  (1.676 m)   Wt 171 lb 9.6 oz (77.8 kg)   SpO2 99%   BMI 27.70 kg/m    Subjective:    Patient ID: Desiree Chavez, female    DOB: 12-May-1981, 43 y.o.   MRN: [id number]  HPI: Desiree Chavez is a 43 y.o. female presenting on 08/06/2023 for comprehensive medical examination. Current medical complaints include:  Notes that her endometriosis has been acting up. She has been having worse cramps. Has been off OCP since her MS diagnosis.   She currently lives with: parents and son Menopausal Symptoms: no  Depression Screen done today and results listed below:     08/06/2023   10:42 AM 04/15/2022   11:37 AM 05/16/2021    3:22 PM 11/13/2019    9:01 AM 03/23/2018   10:07 AM  Depression screen PHQ 2/9  Decreased Interest 0 0 0 0 0  Down, Depressed, Hopeless 0 0 0 0 0  PHQ - 2 Score 0 0 0 0 0  Altered sleeping 0 0 0  0  Tired, decreased energy 0 0 0  0  Change in appetite 0 0 0  0  Feeling bad or failure about yourself  0 0 0  0  Trouble concentrating 0 0 0  0  Moving slowly or fidgety/restless 0 0 0  0  Suicidal thoughts 0 0 0  0  PHQ-9 Score 0 0 0  0  Difficult doing work/chores Not difficult at all Not difficult at all Not difficult at all      Past Medical History:  Past Medical History:  Diagnosis Date   Endometriosis    MS (multiple sclerosis) (HCC)     Surgical History:  Past Surgical History:  Procedure Laterality Date   LAPAROSCOPIC ENDOMETRIOSIS FULGURATION N/A     Medications:  Current Outpatient Medications on File Prior to Visit  Medication Sig   Baclofen 5 MG TABS Take by mouth.   cyanocobalamin 1000 MCG tablet Take by mouth.   fexofenadine (ALLEGRA) 180 MG tablet Take 180 mg by mouth daily.   gabapentin (NEURONTIN) 300 MG capsule Take 300 mg by mouth 2 (two) times daily.   nortriptyline (PAMELOR) 10 MG capsule 20mg  in the AM and 50mg  at night   Ocrelizumab (OCREVUS IV) Inject into the  vein.   ocrelizumab 300 mg in sodium chloride 0.9 % 250 mL Inject 300 mg into the vein once.   rizatriptan (MAXALT) 10 MG tablet TAKE 1 TABLET BY MOUTH AT ONSET OF HEADACHE, CAN REPEAT ONCE AFTER 2 HOURS IF NEEDED. NO MORE THAN 2 TABLETS IN 24 HOURS   No current facility-administered medications on file prior to visit.    Allergies:  Allergies  Allergen Reactions   Codeine Rash    Social History:  Social History   Socioeconomic History   Marital status: Single    Spouse name: Not on file   Number of children: Not on file   Years of education: Not on file   Highest education level: Associate degree: occupational, Scientist, product/process development, or vocational program  Occupational History   Not on file  Tobacco Use   Smoking status: Never   Smokeless tobacco: Never  Vaping Use   Vaping status: Never Used  Substance and Sexual Activity   Alcohol use: Never   Drug use: Never   Sexual activity: Not Currently  Other Topics Concern  Not on file  Social History Narrative   Not on file   Social Drivers of Health   Financial Resource Strain: Patient Declined (08/06/2023)   Overall Financial Resource Strain (CARDIA)    Difficulty of Paying Living Expenses: Patient declined  Food Insecurity: Patient Declined (08/06/2023)   Hunger Vital Sign    Worried About Running Out of Food in the Last Year: Patient declined    Ran Out of Food in the Last Year: Patient declined  Transportation Needs: Patient Declined (08/06/2023)   PRAPARE - Administrator, Civil Service (Medical): Patient declined    Lack of Transportation (Non-Medical): Patient declined  Physical Activity: Unknown (08/06/2023)   Exercise Vital Sign    Days of Exercise per Week: Patient declined    Minutes of Exercise per Session: Not on file  Stress: No Stress Concern Present (08/06/2023)   [employer] of Occupational Health - Occupational Stress Questionnaire    Feeling of Stress : Not at all  Social Connections: Unknown  (08/06/2023)   Social Connection and Isolation Panel [NHANES]    Frequency of Communication with Friends and Family: Patient declined    Frequency of Social Gatherings with Friends and Family: Patient declined    Attends Religious Services: Patient declined    Database administrator or Organizations: No    Attends Engineer, structural: Not on file    Marital Status: Never married  Catering manager Violence: Not on file   Social History   Tobacco Use  Smoking Status Never  Smokeless Tobacco Never   Social History   Substance and Sexual Activity  Alcohol Use Never    Family History:  Family History  Problem Relation Age of Onset   Diabetes Mother    Hypertension Mother    Hyperlipidemia Mother    Depression Mother    Hypertension Father    Hyperlipidemia Father    Depression Father    Stroke Father    Bipolar disorder Father    Bartter's syndrome Sister    Heart disease Maternal Grandmother    COPD Maternal Grandmother    Diabetes Maternal Grandmother    Cancer Maternal Grandfather    Multiple sclerosis Maternal Aunt     Past medical history, surgical history, medications, allergies, family history and social history reviewed with patient today and changes made to appropriate areas of the chart.   Review of Systems  Constitutional:  Positive for diaphoresis. Negative for chills, fever, malaise/fatigue and weight loss.  HENT:  Positive for tinnitus. Negative for congestion, ear discharge, ear pain, hearing loss, nosebleeds, sinus pain and sore throat.   Eyes: Negative.   Respiratory: Negative.  Negative for stridor.   Cardiovascular: Negative.   Gastrointestinal:  Positive for constipation and heartburn. Negative for abdominal pain, blood in stool, diarrhea, melena, nausea and vomiting.  Genitourinary:  Positive for dysuria. Negative for flank pain, frequency, hematuria and urgency.  Musculoskeletal:  Positive for myalgias. Negative for back pain, falls, joint  pain and neck pain.  Skin:  Positive for rash. Negative for itching.  Neurological:  Positive for dizziness, tingling and weakness. Negative for tremors, sensory change, speech change, focal weakness, seizures, loss of consciousness and headaches.  Endo/Heme/Allergies:  Positive for polydipsia. Negative for environmental allergies. Bruises/bleeds easily.  Psychiatric/Behavioral:  Negative for depression, hallucinations, memory loss, substance abuse and suicidal ideas. The patient is nervous/anxious. The patient does not have insomnia.    All other ROS negative except what is listed above and in the  HPI.      Objective:    BP 93/66   Pulse 80   Temp 98.2 F (36.8 C) (Oral)   Ht 5\' 6"  (1.676 m)   Wt 171 lb 9.6 oz (77.8 kg)   SpO2 99%   BMI 27.70 kg/m   Wt Readings from Last 3 Encounters:  08/06/23 171 lb 9.6 oz (77.8 kg)  04/15/22 172 lb 9.6 oz (78.3 kg)  12/04/21 176 lb 12.8 oz (80.2 kg)    Physical Exam Vitals and nursing note reviewed. Exam conducted with a chaperone present.  Constitutional:      General: She is not in acute distress.    Appearance: Normal appearance. She is not ill-appearing, toxic-appearing or diaphoretic.  HENT:     Head: Normocephalic and atraumatic.     Right Ear: Tympanic membrane, ear canal and external ear normal. There is no impacted cerumen.     Left Ear: Tympanic membrane, ear canal and external ear normal. There is no impacted cerumen.     Nose: Nose normal. No congestion or rhinorrhea.     Mouth/Throat:     Mouth: Mucous membranes are moist.     Pharynx: Oropharynx is clear. No oropharyngeal exudate or posterior oropharyngeal erythema.  Eyes:     General: No scleral icterus.       Right eye: No discharge.        Left eye: No discharge.     Extraocular Movements: Extraocular movements intact.     Conjunctiva/sclera: Conjunctivae normal.     Pupils: Pupils are equal, round, and reactive to light.  Neck:     Vascular: No carotid bruit.   Cardiovascular:     Rate and Rhythm: Normal rate and regular rhythm.     Pulses: Normal pulses.     Heart sounds: No murmur heard.    No friction rub. No gallop.  Pulmonary:     Effort: Pulmonary effort is normal. No respiratory distress.     Breath sounds: Normal breath sounds. No stridor. No wheezing, rhonchi or rales.  Chest:     Chest wall: No tenderness.  Abdominal:     General: Abdomen is flat. Bowel sounds are normal. There is no distension.     Palpations: Abdomen is soft. There is no mass.     Tenderness: There is no abdominal tenderness. There is no right CVA tenderness, left CVA tenderness, guarding or rebound.     Hernia: No hernia is present.  Genitourinary:    Labia:        Right: No rash, tenderness, lesion or injury.        Left: No rash, tenderness, lesion or injury.      Vagina: Normal.     Cervix: Cervical bleeding present.     Uterus: Normal.   Musculoskeletal:        General: No swelling, tenderness, deformity or signs of injury.     Cervical back: Normal range of motion and neck supple. No rigidity. No muscular tenderness.     Right lower leg: No edema.     Left lower leg: No edema.  Lymphadenopathy:     Cervical: No cervical adenopathy.  Skin:    General: Skin is warm and dry.     Capillary Refill: Capillary refill takes less than 2 seconds.     Coloration: Skin is not jaundiced or pale.     Findings: Rash (dry irrtiated rash on bilateral arms) present. No bruising, erythema or lesion.  Neurological:  General: No focal deficit present.     Mental Status: She is alert and oriented to person, place, and time. Mental status is at baseline.     Cranial Nerves: No cranial nerve deficit.     Sensory: No sensory deficit.     Motor: No weakness.     Coordination: Coordination normal.     Gait: Gait normal.     Deep Tendon Reflexes: Reflexes normal.  Psychiatric:        Mood and Affect: Mood normal.        Behavior: Behavior normal.        Thought  Content: Thought content normal.        Judgment: Judgment normal.     Results for orders placed or performed in visit on 04/15/22  Rapid Strep Screen ([facility] ONLY)   Collection Time: 04/15/22 11:30 AM   Specimen: Other   Other  Result Value Ref Range   Strep Gp A Ag, IA W/Reflex Negative Negative  Culture, Group A Strep   Collection Time: 04/15/22 11:30 AM   Other  Result Value Ref Range   Strep A Culture Negative   Veritor Flu A/B Waived   Collection Time: 04/15/22 11:30 AM  Result Value Ref Range   Influenza A Negative Negative   Influenza B Negative Negative  Novel Coronavirus, NAA ([facility])   Collection Time: 04/15/22 11:45 AM   Specimen: Saline  Result Value Ref Range   SARS-CoV-2, NAA Not Detected Not Detected      Assessment & Plan:   Problem List Items Addressed This Visit       Nervous and Auditory   Multiple sclerosis (HCC)   Continue to follow with neurology. Call with any concerns.       Relevant Medications   Ocrelizumab (OCREVUS IV)   ocrelizumab 300 mg in sodium chloride 0.9 % 250 mL     Other   Endometriosis   Will refer to GYN to discuss other options. Await their input.       Relevant Orders   Ambulatory referral to Obstetrics / Gynecology   Other Visit Diagnoses       Routine general medical examination at a health care facility    -  Primary   Vaccines up to date. Screening labs checked today. Pap done. Mammo ordered. Will order cologuard after her birthday. Continue diet and exercise. Call w concerns   Relevant Orders   CBC with Differential/Platelet   Comprehensive metabolic panel   Lipid Panel w/o Chol/HDL Ratio   Cytology - PAP   TSH     Eczema, unspecified type       Will start triamcinalone. Call with any concerns.     Cloudy urine       +UTI.   Relevant Orders   Urinalysis, Routine w reflex microscopic     Acute cystitis with hematuria       Will treat with nitrofurantoin and check culture. Await results.    Relevant Orders   Urine Culture        Follow up plan: Return in about 1 year (around 08/05/2024) for physical.   LABORATORY TESTING:  - Pap smear: pap done  IMMUNIZATIONS:   - Tdap: Tetanus vaccination status reviewed: last tetanus booster within 10 years. - Influenza: Refused - COVID: Refused - HPV: Not applicable  SCREENING: -Mammogram: Ordered today  - Colonoscopy: Ordered today    PATIENT COUNSELING:   Advised to take 1 mg of folate supplement per day  if capable of pregnancy.   Sexuality: Discussed sexually transmitted diseases, partner selection, use of condoms, avoidance of unintended pregnancy  and contraceptive alternatives.   Advised to avoid cigarette smoking.  I discussed with the patient that most people either abstain from alcohol or drink within safe limits (<=14/week and <=4 drinks/occasion for males, <=7/weeks and <= 3 drinks/occasion for females) and that the risk for alcohol disorders and other health effects rises proportionally with the number of drinks per week and how often a drinker exceeds daily limits.  Discussed cessation/primary prevention of drug use and availability of treatment for abuse.   Diet: Encouraged to adjust caloric intake to maintain  or achieve ideal body weight, to reduce intake of dietary saturated fat and total fat, to limit sodium intake by avoiding high sodium foods and not adding table salt, and to maintain adequate dietary potassium and calcium preferably from fresh fruits, vegetables, and low-fat dairy products.    stressed the importance of regular exercise  Injury prevention: Discussed safety belts, safety helmets, smoke detector, smoking near bedding or upholstery.   Dental health: Discussed importance of regular tooth brushing, flossing, and dental visits.    NEXT PREVENTATIVE PHYSICAL DUE IN 1 YEAR. Return in about 1 year (around 08/05/2024) for physical.

## 2023-08-07 LAB — CBC WITH DIFFERENTIAL/PLATELET
Basophils Absolute: 0 10*3/uL (ref 0.0–0.2)
Basos: 1 %
EOS (ABSOLUTE): 0.1 10*3/uL (ref 0.0–0.4)
Eos: 2 %
Hematocrit: 38.9 % (ref 34.0–46.6)
Hemoglobin: 12.6 g/dL (ref 11.1–15.9)
Immature Grans (Abs): 0 10*3/uL (ref 0.0–0.1)
Immature Granulocytes: 0 %
Lymphocytes Absolute: 1.5 10*3/uL (ref 0.7–3.1)
Lymphs: 23 %
MCH: 29.2 pg (ref 26.6–33.0)
MCHC: 32.4 g/dL (ref 31.5–35.7)
MCV: 90 fL (ref 79–97)
Monocytes Absolute: 0.6 10*3/uL (ref 0.1–0.9)
Monocytes: 9 %
Neutrophils Absolute: 4.3 10*3/uL (ref 1.4–7.0)
Neutrophils: 65 %
Platelets: 298 10*3/uL (ref 150–450)
RBC: 4.32 x10E6/uL (ref 3.77–5.28)
RDW: 13.1 % (ref 11.7–15.4)
WBC: 6.5 10*3/uL (ref 3.4–10.8)

## 2023-08-07 LAB — COMPREHENSIVE METABOLIC PANEL
ALT: 12 [IU]/L (ref 0–32)
AST: 11 [IU]/L (ref 0–40)
Albumin: 4.4 g/dL (ref 3.9–4.9)
Alkaline Phosphatase: 56 [IU]/L (ref 44–121)
BUN/Creatinine Ratio: 24 — ABNORMAL HIGH (ref 9–23)
BUN: 17 mg/dL (ref 6–24)
Bilirubin Total: 0.8 mg/dL (ref 0.0–1.2)
CO2: 23 mmol/L (ref 20–29)
Calcium: 9.4 mg/dL (ref 8.7–10.2)
Chloride: 102 mmol/L (ref 96–106)
Creatinine, Ser: 0.71 mg/dL (ref 0.57–1.00)
Globulin, Total: 2.6 g/dL (ref 1.5–4.5)
Glucose: 86 mg/dL (ref 70–99)
Potassium: 4.1 mmol/L (ref 3.5–5.2)
Sodium: 139 mmol/L (ref 134–144)
Total Protein: 7 g/dL (ref 6.0–8.5)
eGFR: 107 mL/min/{1.73_m2} (ref >59)

## 2023-08-07 LAB — LIPID PANEL W/O CHOL/HDL RATIO
Cholesterol, Total: 192 mg/dL (ref 100–199)
HDL: 53 mg/dL (ref >39)
LDL Chol Calc (NIH): 126 mg/dL — ABNORMAL HIGH (ref 0–99)
Triglycerides: 72 mg/dL (ref 0–149)
VLDL Cholesterol Cal: 13 mg/dL (ref 5–40)

## 2023-08-07 LAB — TSH: TSH: 1.38 u[IU]/mL (ref 0.450–4.500)

## 2023-08-08 ENCOUNTER — Encounter: Payer: Self-pay | Admitting: Family Medicine

## 2023-08-11 LAB — CYTOLOGY - PAP
Comment: NEGATIVE
Diagnosis: UNDETERMINED — AB
High risk HPV: NEGATIVE

## 2023-08-14 DIAGNOSIS — Z419 Encounter for procedure for purposes other than remedying health state, unspecified: Secondary | ICD-10-CM | POA: Diagnosis not present

## 2023-08-20 ENCOUNTER — Other Ambulatory Visit: Payer: Self-pay | Admitting: Family Medicine

## 2023-08-20 MED ORDER — CIPROFLOXACIN HCL 500 MG PO TABS: 500.0000 mg | ORAL_TABLET | Freq: Two times a day (BID) | ORAL | 0 refills | Status: AC

## 2023-08-30 DIAGNOSIS — M79671 Pain in right foot: Secondary | ICD-10-CM | POA: Diagnosis not present

## 2023-08-30 DIAGNOSIS — R29818 Other symptoms and signs involving the nervous system: Secondary | ICD-10-CM | POA: Diagnosis not present

## 2023-08-30 DIAGNOSIS — R413 Other amnesia: Secondary | ICD-10-CM | POA: Diagnosis not present

## 2023-08-30 DIAGNOSIS — R29898 Other symptoms and signs involving the musculoskeletal system: Secondary | ICD-10-CM | POA: Diagnosis not present

## 2023-08-30 DIAGNOSIS — M79641 Pain in right hand: Secondary | ICD-10-CM | POA: Diagnosis not present

## 2023-08-30 DIAGNOSIS — G35 Multiple sclerosis: Secondary | ICD-10-CM | POA: Diagnosis not present

## 2023-08-30 DIAGNOSIS — R519 Headache, unspecified: Secondary | ICD-10-CM | POA: Diagnosis not present

## 2023-08-30 DIAGNOSIS — M79642 Pain in left hand: Secondary | ICD-10-CM | POA: Diagnosis not present

## 2023-08-30 DIAGNOSIS — R252 Cramp and spasm: Secondary | ICD-10-CM | POA: Diagnosis not present

## 2023-08-30 DIAGNOSIS — R5383 Other fatigue: Secondary | ICD-10-CM | POA: Diagnosis not present

## 2023-08-31 ENCOUNTER — Other Ambulatory Visit: Payer: Self-pay | Admitting: Neurology

## 2023-08-31 DIAGNOSIS — R519 Headache, unspecified: Secondary | ICD-10-CM

## 2023-08-31 DIAGNOSIS — R29818 Other symptoms and signs involving the nervous system: Secondary | ICD-10-CM

## 2023-08-31 DIAGNOSIS — R29898 Other symptoms and signs involving the musculoskeletal system: Secondary | ICD-10-CM

## 2023-08-31 DIAGNOSIS — G35 Multiple sclerosis: Secondary | ICD-10-CM

## 2023-09-03 ENCOUNTER — Other Ambulatory Visit: Payer: Self-pay | Admitting: Neurology

## 2023-09-03 DIAGNOSIS — R29818 Other symptoms and signs involving the nervous system: Secondary | ICD-10-CM

## 2023-09-03 DIAGNOSIS — G35 Multiple sclerosis: Secondary | ICD-10-CM

## 2023-09-03 DIAGNOSIS — R29898 Other symptoms and signs involving the musculoskeletal system: Secondary | ICD-10-CM

## 2023-09-03 DIAGNOSIS — R519 Headache, unspecified: Secondary | ICD-10-CM

## 2023-09-08 ENCOUNTER — Encounter: Payer: Self-pay | Admitting: Obstetrics and Gynecology

## 2023-09-08 ENCOUNTER — Ambulatory Visit (INDEPENDENT_AMBULATORY_CARE_PROVIDER_SITE_OTHER): Payer: Medicaid Other | Admitting: Obstetrics and Gynecology

## 2023-09-08 VITALS — BP 101/69 | HR 92 | Ht 66.0 in | Wt 172.8 lb

## 2023-09-08 DIAGNOSIS — N809 Endometriosis, unspecified: Secondary | ICD-10-CM

## 2023-09-08 DIAGNOSIS — Z7689 Persons encountering health services in other specified circumstances: Secondary | ICD-10-CM

## 2023-09-08 DIAGNOSIS — R102 Pelvic and perineal pain: Secondary | ICD-10-CM

## 2023-09-08 MED ORDER — LEVONORGEST-ETH ESTRAD 91-DAY 0.15-0.03 &0.01 MG PO TABS: 1.0000 | ORAL_TABLET | Freq: Every day | ORAL | 1 refills | Status: AC

## 2023-09-08 NOTE — Progress Notes (Signed)
 Patient presents today due to history of endometriosis. She states wanting to discuss her options as her pain has been worsening.

## 2023-09-08 NOTE — Progress Notes (Signed)
 HPI:      [name] is a 43 y.o. G1P0 who LMP was Patient's last menstrual period was 08/02/2023 (approximate).  Subjective:   She presents today to discuss in the reassess.  She has a long history of endometriosis surgically diagnosed.  Her last surgery for endometriosis showed significant pelvic adhesive disease and endometriosis around her ureter.  The surgeon decided not to operate further because of the extent of this endometriosis. She generally has normal monthly cycles.  In December she had 2 cycles but otherwise they are monthly.  She has not been on OCPs since her diagnosis of MS.  She believes that this was only because she was hospitalized and all medications were stopped.  She reports that when she was on OCPs her cycles seem to be better.  Her primary pain occurs with menses. She is not sexually active.    Hx: The following portions of the patient's history were reviewed and updated as appropriate:             She  has a past medical history of Endometriosis and MS (multiple sclerosis) (HCC). She does not have any pertinent problems on file. She  has a past surgical history that includes Laparoscopic endometriosis fulguration (N/A). Her family history includes Bartter's syndrome in her sister; Bipolar disorder in her father; COPD in her maternal grandmother; Cancer in her maternal grandfather; Depression in her father and mother; Diabetes in her maternal grandmother and mother; Heart disease in her maternal grandmother; Hyperlipidemia in her father and mother; Hypertension in her father and mother; Multiple sclerosis in her maternal aunt; Stroke in her father. She  reports that she has never smoked. She has never used smokeless tobacco. She reports that she does not drink alcohol and does not use drugs. She has a current medication list which includes the following prescription(s): baclofen, cyanocobalamin, fexofenadine, gabapentin, levonorgestrel-ethinyl estradiol,  nortriptyline, rizatriptan, triamcinolone ointment, and ocrelizumab. She is allergic to codeine.       Review of Systems:  Review of Systems  Constitutional: Denied constitutional symptoms, night sweats, recent illness, fatigue, fever, insomnia and weight loss.  Eyes: Denied eye symptoms, eye pain, photophobia, vision change and visual disturbance.  Ears/Nose/Throat/Neck: Denied ear, nose, throat or neck symptoms, hearing loss, nasal discharge, sinus congestion and sore throat.  Cardiovascular: Denied cardiovascular symptoms, arrhythmia, chest pain/pressure, edema, exercise intolerance, orthopnea and palpitations.  Respiratory: Denied pulmonary symptoms, asthma, pleuritic pain, productive sputum, cough, dyspnea and wheezing.  Gastrointestinal: Denied, gastro-esophageal reflux, melena, nausea and vomiting.  Genitourinary: See HPI for additional information.  Musculoskeletal: Denied musculoskeletal symptoms, stiffness, swelling, muscle weakness and myalgia.  Dermatologic: Denied dermatology symptoms, rash and scar.  Neurologic: Denied neurology symptoms, dizziness, headache, neck pain and syncope.  Psychiatric: Denied psychiatric symptoms, anxiety and depression.  Endocrine: Denied endocrine symptoms including hot flashes and night sweats.   Meds:   Current Outpatient Medications on File Prior to Visit  Medication Sig Dispense Refill   Baclofen 5 MG TABS Take by mouth.     cyanocobalamin 1000 MCG tablet Take by mouth.     fexofenadine (ALLEGRA) 180 MG tablet Take 180 mg by mouth daily.     gabapentin (NEURONTIN) 300 MG capsule Take 300 mg by mouth 2 (two) times daily.     nortriptyline (PAMELOR) 10 MG capsule 20mg  in the AM and 50mg  at night     rizatriptan (MAXALT) 10 MG tablet TAKE 1 TABLET BY MOUTH AT ONSET OF HEADACHE, CAN REPEAT ONCE AFTER 2  HOURS IF NEEDED. NO MORE THAN 2 TABLETS IN 24 HOURS     triamcinolone ointment (KENALOG) 0.5 % Apply 1 Application topically 2 (two) times daily.  30 g 2   Ocrelizumab (OCREVUS IV) Inject into the vein. (Patient not taking: Reported on 09/08/2023)     No current facility-administered medications on file prior to visit.      Objective:     Vitals:   09/08/23 1022  BP: 101/69  Pulse: 92   Filed Weights   09/08/23 1022  Weight: 172 lb 12.8 oz (78.4 kg)                        Assessment:    G1P0 Patient Active Problem List   Diagnosis Date Noted   Difficulty balancing 08/28/2019   Headache disorder 02/20/2019   Multiple sclerosis (HCC)    Numbness and tingling of both feet 03/31/2018   Allergic rhinitis 02/18/2018   Endometriosis 02/18/2018     1. Establishing care with new doctor, encounter for   2. Endometriosis   3. Pelvic pain in female     Operative reports unavailable -history based on patient's oral report.   Plan:            1.  Endometriosis We have discussed endometriosis in detail.  I informed her that endometriosis is a life-long, often progressive disease that persists until menopause in many people.  I told her that endometriosis is often affected by hormones and that certain conditions resulting in changes of these hormones can treat endometriosis.  We have discussed the resultant inflammation, adhesions and damage to internal organs that can occur with endometriosis.  We have discussed the decreased fertility often caused by this disease.  The type and timing of pain of endometriosis has also been discussed.  The possible treatment options from expectant management through major surgery have been reviewed, and each appropriate treatment discussed individually in relation to the extent of endometriosis.   She has decided to begin 47-month OCPs as a initial treatment and see if this makes enough of a difference.  Would move onto IUD or Myfembree as next option.  Surgery not really an option at this time unless she would like to go possibly to [facility] or [facility] and have lighted stents placed and plan for a  difficult procedure.  Orders No orders of the defined types were placed in this encounter.    Meds ordered this encounter  Medications   Levonorgestrel-Ethinyl Estradiol (AMETHIA) 0.15-0.03 &0.01 MG tablet    Sig: Take 1 tablet by mouth at bedtime.    Dispense:  84 tablet    Refill:  1      F/U  Return in about 4 months (around 01/06/2024).  Elonda Husky, M.D. 09/08/2023 11:05 AM

## 2023-09-11 DIAGNOSIS — Z419 Encounter for procedure for purposes other than remedying health state, unspecified: Secondary | ICD-10-CM | POA: Diagnosis not present

## 2023-09-21 ENCOUNTER — Encounter: Payer: Self-pay | Admitting: Neurology

## 2023-09-21 DIAGNOSIS — G35 Multiple sclerosis: Secondary | ICD-10-CM | POA: Diagnosis not present

## 2023-09-21 DIAGNOSIS — Z79899 Other long term (current) drug therapy: Secondary | ICD-10-CM | POA: Diagnosis not present

## 2023-09-29 ENCOUNTER — Other Ambulatory Visit: Payer: Medicaid Other

## 2023-09-29 ENCOUNTER — Inpatient Hospital Stay: Admission: RE | Admit: 2023-09-29 | Payer: Medicaid Other | Source: Ambulatory Visit

## 2023-10-06 ENCOUNTER — Ambulatory Visit
Admission: RE | Admit: 2023-10-06 | Discharge: 2023-10-06 | Disposition: A | Discharge status: Home / Self Care | Source: Ambulatory Visit | Attending: Neurology | Admitting: Neurology

## 2023-10-06 DIAGNOSIS — R29818 Other symptoms and signs involving the nervous system: Secondary | ICD-10-CM

## 2023-10-06 DIAGNOSIS — G35 Multiple sclerosis: Secondary | ICD-10-CM

## 2023-10-06 DIAGNOSIS — R519 Headache, unspecified: Secondary | ICD-10-CM

## 2023-10-06 DIAGNOSIS — R29898 Other symptoms and signs involving the musculoskeletal system: Secondary | ICD-10-CM

## 2023-10-06 MED ORDER — GADOPICLENOL 0.5 MMOL/ML IV SOLN
7.5000 mL | Freq: Once | INTRAVENOUS | Status: AC | PRN
Start: 2023-10-06 — End: 2023-10-06
  Administered 2023-10-06: 7.5 mL via INTRAVENOUS

## 2023-10-23 DIAGNOSIS — Z419 Encounter for procedure for purposes other than remedying health state, unspecified: Secondary | ICD-10-CM | POA: Diagnosis not present

## 2023-11-22 DIAGNOSIS — Z419 Encounter for procedure for purposes other than remedying health state, unspecified: Secondary | ICD-10-CM | POA: Diagnosis not present

## 2023-12-03 ENCOUNTER — Telehealth: Payer: Self-pay | Admitting: Family Medicine

## 2023-12-03 DIAGNOSIS — Z1211 Encounter for screening for malignant neoplasm of colon: Secondary | ICD-10-CM

## 2023-12-03 NOTE — Telephone Encounter (Signed)
-----   Message from Terre Ferri sent at 08/06/2023 11:12 AM EST ----- Order Cologuard

## 2023-12-07 DIAGNOSIS — R5383 Other fatigue: Secondary | ICD-10-CM | POA: Diagnosis not present

## 2023-12-07 DIAGNOSIS — R200 Anesthesia of skin: Secondary | ICD-10-CM | POA: Diagnosis not present

## 2023-12-07 DIAGNOSIS — G35 Multiple sclerosis: Secondary | ICD-10-CM | POA: Diagnosis not present

## 2023-12-07 DIAGNOSIS — M79671 Pain in right foot: Secondary | ICD-10-CM | POA: Diagnosis not present

## 2023-12-07 DIAGNOSIS — Z1331 Encounter for screening for depression: Secondary | ICD-10-CM | POA: Diagnosis not present

## 2023-12-07 DIAGNOSIS — R519 Headache, unspecified: Secondary | ICD-10-CM | POA: Diagnosis not present

## 2023-12-07 DIAGNOSIS — R252 Cramp and spasm: Secondary | ICD-10-CM | POA: Diagnosis not present

## 2023-12-07 DIAGNOSIS — M79672 Pain in left foot: Secondary | ICD-10-CM | POA: Diagnosis not present

## 2023-12-07 DIAGNOSIS — R29898 Other symptoms and signs involving the musculoskeletal system: Secondary | ICD-10-CM | POA: Diagnosis not present

## 2023-12-07 DIAGNOSIS — R29818 Other symptoms and signs involving the nervous system: Secondary | ICD-10-CM | POA: Diagnosis not present

## 2023-12-14 ENCOUNTER — Encounter: Payer: Self-pay | Admitting: Family Medicine

## 2023-12-14 ENCOUNTER — Ambulatory Visit (INDEPENDENT_AMBULATORY_CARE_PROVIDER_SITE_OTHER): Admitting: Family Medicine

## 2023-12-14 VITALS — BP 102/69 | HR 81 | Temp 97.4°F | Ht 66.0 in | Wt 178.8 lb

## 2023-12-14 DIAGNOSIS — J0100 Acute maxillary sinusitis, unspecified: Secondary | ICD-10-CM

## 2023-12-14 MED ORDER — AMOXICILLIN-POT CLAVULANATE 875-125 MG PO TABS: 1.0000 | ORAL_TABLET | Freq: Two times a day (BID) | ORAL | 0 refills | Status: DC

## 2023-12-14 NOTE — Progress Notes (Signed)
 BP 102/69 (BP Location: Left Arm, Patient Position: Sitting, Cuff Size: Normal)   Pulse 81   Temp (!) 97.4 F (36.3 C) (Oral)   Ht 5\' 6"  (1.676 m)   Wt 178 lb 12.8 oz (81.1 kg)   SpO2 99%   BMI 28.86 kg/m    Subjective:    Patient ID: Desiree Chavez, female    DOB: Jun 18, 1981, 43 y.o.   MRN: [id number]  HPI: ANALIZ TVEDT is a 43 y.o. female  Chief Complaint  Patient presents with   Sinusitis   UPPER RESPIRATORY TRACT INFECTION Duration: about 2 weeks Worst symptom: congestion and pain Fever: no Cough: no Shortness of breath: no Wheezing: no Chest pain: no Chest tightness: no Chest congestion: no Nasal congestion: yes Runny nose: yes Post nasal drip: yes Sneezing: no Sore throat: no Swollen glands: no Sinus pressure: yes Headache: yes Face pain: yes Toothache: yes Ear pain: yes bilateral Ear pressure: yes bilateral Eyes red/itching:no Eye drainage/crusting: no  Vomiting: no Rash: no Fatigue: yes Sick contacts: no Strep contacts: no  Context: stable Recurrent sinusitis: no Relief with OTC cold/cough medications: no  Treatments attempted: sudafed, mucinex    Relevant past medical, surgical, family and social history reviewed and updated as indicated. Interim medical history since our last visit reviewed. Allergies and medications reviewed and updated.  Review of Systems  Constitutional: Negative.   HENT:  Positive for congestion, postnasal drip, rhinorrhea, sinus pressure and sinus pain. Negative for dental problem, drooling, ear discharge, ear pain, facial swelling, hearing loss, mouth sores, nosebleeds, sneezing, sore throat, tinnitus, trouble swallowing and voice change.   Eyes: Negative.   Respiratory: Negative.    Cardiovascular: Negative.   Gastrointestinal: Negative.   Psychiatric/Behavioral: Negative.      Per HPI unless specifically indicated above     Objective:     BP 102/69 (BP Location: Left Arm, Patient Position:  Sitting, Cuff Size: Normal)   Pulse 81   Temp (!) 97.4 F (36.3 C) (Oral)   Ht 5\' 6"  (1.676 m)   Wt 178 lb 12.8 oz (81.1 kg)   SpO2 99%   BMI 28.86 kg/m   Wt Readings from Last 3 Encounters:  12/14/23 178 lb 12.8 oz (81.1 kg)  09/08/23 172 lb 12.8 oz (78.4 kg)  08/06/23 171 lb 9.6 oz (77.8 kg)    Physical Exam Vitals and nursing note reviewed.  Constitutional:      General: She is not in acute distress.    Appearance: Normal appearance. She is not ill-appearing, toxic-appearing or diaphoretic.  HENT:     Head: Normocephalic and atraumatic.     Right Ear: Tympanic membrane, ear canal and external ear normal.     Left Ear: Tympanic membrane, ear canal and external ear normal.     Nose: Congestion and rhinorrhea present.     Mouth/Throat:     Mouth: Mucous membranes are moist.     Pharynx: Oropharynx is clear. Posterior oropharyngeal erythema present. No oropharyngeal exudate.  Eyes:     General: No scleral icterus.       Right eye: No discharge.        Left eye: No discharge.     Extraocular Movements: Extraocular movements intact.     Conjunctiva/sclera: Conjunctivae normal.     Pupils: Pupils are equal, round, and reactive to light.  Cardiovascular:     Rate and Rhythm: Normal rate and regular rhythm.     Pulses: Normal pulses.     Heart  sounds: Normal heart sounds. No murmur heard.    No friction rub. No gallop.  Pulmonary:     Effort: Pulmonary effort is normal. No respiratory distress.     Breath sounds: Normal breath sounds. No stridor. No wheezing, rhonchi or rales.  Chest:     Chest wall: No tenderness.  Musculoskeletal:        General: Normal range of motion.     Cervical back: Normal range of motion and neck supple.  Skin:    General: Skin is warm and dry.     Capillary Refill: Capillary refill takes less than 2 seconds.     Coloration: Skin is not jaundiced or pale.     Findings: No bruising, erythema, lesion or rash.  Neurological:     General: No focal  deficit present.     Mental Status: She is alert and oriented to person, place, and time. Mental status is at baseline.  Psychiatric:        Mood and Affect: Mood normal.        Behavior: Behavior normal.        Thought Content: Thought content normal.        Judgment: Judgment normal.     Results for orders placed or performed in visit on 08/06/23  Cytology - PAP   Collection Time: 08/06/23 11:08 AM  Result Value Ref Range   High risk HPV Negative    Adequacy      Satisfactory for evaluation; transformation zone component PRESENT.   Diagnosis (A)     - Atypical squamous cells of undetermined significance (ASC-US )   Comment Normal Reference Range HPV - Negative   Microscopic Examination   Collection Time: 08/06/23 11:15 AM   Urine  Result Value Ref Range   WBC, UA >30 (A) 0 - 5 /hpf   RBC, Urine 0-2 0 - 2 /hpf   Epithelial Cells (non renal) 0-10 0 - 10 /hpf   Bacteria, UA Moderate (A) None seen/Few  Urinalysis, Routine w reflex microscopic   Collection Time: 08/06/23 11:15 AM  Result Value Ref Range   Specific Gravity, UA 1.020 1.005 - 1.030   pH, UA 7.5 5.0 - 7.5   Color, UA Yellow Yellow   Appearance Ur Cloudy (A) Clear   Leukocytes,UA 3+ (A) Negative   Protein,UA 1+ (A) Negative/Trace   Glucose, UA Negative Negative   Ketones, UA Negative Negative   RBC, UA 1+ (A) Negative   Bilirubin, UA Negative Negative   Urobilinogen, Ur 0.2 0.2 - 1.0 mg/dL   Nitrite, UA Negative Negative   Microscopic Examination See below:   CBC with Differential/Platelet   Collection Time: 08/06/23 11:16 AM  Result Value Ref Range   WBC 6.5 3.4 - 10.8 x10E3/uL   RBC 4.32 3.77 - 5.28 x10E6/uL   Hemoglobin 12.6 11.1 - 15.9 g/dL   Hematocrit 40.9 81.1 - 46.6 %   MCV 90 79 - 97 fL   MCH 29.2 26.6 - 33.0 pg   MCHC 32.4 31.5 - 35.7 g/dL   RDW 91.4 78.2 - 95.6 %   Platelets 298 150 - 450 x10E3/uL   Neutrophils 65 Not Estab. %   Lymphs 23 Not Estab. %   Monocytes 9 Not Estab. %   Eos 2 Not  Estab. %   Basos 1 Not Estab. %   Neutrophils Absolute 4.3 1.4 - 7.0 x10E3/uL   Lymphocytes Absolute 1.5 0.7 - 3.1 x10E3/uL   Monocytes Absolute 0.6 0.1 - 0.9 x10E3/uL  EOS (ABSOLUTE) 0.1 0.0 - 0.4 x10E3/uL   Basophils Absolute 0.0 0.0 - 0.2 x10E3/uL   Immature Granulocytes 0 Not Estab. %   Immature Grans (Abs) 0.0 0.0 - 0.1 x10E3/uL  Comprehensive metabolic panel   Collection Time: 08/06/23 11:16 AM  Result Value Ref Range   Glucose 86 70 - 99 mg/dL   BUN 17 6 - 24 mg/dL   Creatinine, Ser 1.61 0.57 - 1.00 mg/dL   eGFR 096 >04 VW/UJW/1.19   BUN/Creatinine Ratio 24 (H) 9 - 23   Sodium 139 134 - 144 mmol/L   Potassium 4.1 3.5 - 5.2 mmol/L   Chloride 102 96 - 106 mmol/L   CO2 23 20 - 29 mmol/L   Calcium 9.4 8.7 - 10.2 mg/dL   Total Protein 7.0 6.0 - 8.5 g/dL   Albumin 4.4 3.9 - 4.9 g/dL   Globulin, Total 2.6 1.5 - 4.5 g/dL   Bilirubin Total 0.8 0.0 - 1.2 mg/dL   Alkaline Phosphatase 56 44 - 121 IU/L   AST 11 0 - 40 IU/L   ALT 12 0 - 32 IU/L  Lipid Panel w/o Chol/HDL Ratio   Collection Time: 08/06/23 11:16 AM  Result Value Ref Range   Cholesterol, Total 192 100 - 199 mg/dL   Triglycerides 72 0 - 149 mg/dL   HDL 53 >14 mg/dL   VLDL Cholesterol Cal 13 5 - 40 mg/dL   LDL Chol Calc (NIH) 782 (H) 0 - 99 mg/dL  TSH   Collection Time: 08/06/23 11:16 AM  Result Value Ref Range   TSH 1.380 0.450 - 4.500 uIU/mL      Assessment & Plan:   Problem List Items Addressed This Visit   None Visit Diagnoses       Acute non-recurrent maxillary sinusitis    -  Primary   Will treat with augmentin . Call with any concerns. Continue to monitor.   Relevant Medications   amoxicillin -clavulanate (AUGMENTIN ) 875-125 MG tablet        Follow up plan: Return if symptoms worsen or fail to improve.

## 2023-12-23 DIAGNOSIS — Z419 Encounter for procedure for purposes other than remedying health state, unspecified: Secondary | ICD-10-CM | POA: Diagnosis not present

## 2024-01-22 DIAGNOSIS — Z419 Encounter for procedure for purposes other than remedying health state, unspecified: Secondary | ICD-10-CM | POA: Diagnosis not present

## 2024-02-08 ENCOUNTER — Ambulatory Visit: Admitting: Family Medicine

## 2024-02-22 DIAGNOSIS — Z419 Encounter for procedure for purposes other than remedying health state, unspecified: Secondary | ICD-10-CM | POA: Diagnosis not present

## 2024-03-08 DIAGNOSIS — R29898 Other symptoms and signs involving the musculoskeletal system: Secondary | ICD-10-CM | POA: Diagnosis not present

## 2024-03-08 DIAGNOSIS — R413 Other amnesia: Secondary | ICD-10-CM | POA: Diagnosis not present

## 2024-03-08 DIAGNOSIS — M79641 Pain in right hand: Secondary | ICD-10-CM | POA: Diagnosis not present

## 2024-03-08 DIAGNOSIS — R200 Anesthesia of skin: Secondary | ICD-10-CM | POA: Diagnosis not present

## 2024-03-08 DIAGNOSIS — R202 Paresthesia of skin: Secondary | ICD-10-CM | POA: Diagnosis not present

## 2024-03-08 DIAGNOSIS — M79671 Pain in right foot: Secondary | ICD-10-CM | POA: Diagnosis not present

## 2024-03-08 DIAGNOSIS — R29818 Other symptoms and signs involving the nervous system: Secondary | ICD-10-CM | POA: Diagnosis not present

## 2024-03-08 DIAGNOSIS — R5383 Other fatigue: Secondary | ICD-10-CM | POA: Diagnosis not present

## 2024-03-08 DIAGNOSIS — G35 Multiple sclerosis: Secondary | ICD-10-CM | POA: Diagnosis not present

## 2024-03-08 DIAGNOSIS — R519 Headache, unspecified: Secondary | ICD-10-CM | POA: Diagnosis not present

## 2024-03-08 DIAGNOSIS — R252 Cramp and spasm: Secondary | ICD-10-CM | POA: Diagnosis not present

## 2024-03-16 ENCOUNTER — Ambulatory Visit (INDEPENDENT_AMBULATORY_CARE_PROVIDER_SITE_OTHER): Admitting: Family Medicine

## 2024-03-16 VITALS — BP 101/69 | HR 75 | Temp 98.0°F | Ht 66.0 in | Wt 175.0 lb

## 2024-03-16 DIAGNOSIS — R0981 Nasal congestion: Secondary | ICD-10-CM | POA: Diagnosis not present

## 2024-03-16 MED ORDER — PREDNISONE 50 MG PO TABS: 50.0000 mg | ORAL_TABLET | Freq: Every day | ORAL | 0 refills | Status: DC

## 2024-03-16 MED ORDER — AMOXICILLIN-POT CLAVULANATE 875-125 MG PO TABS
1.0000 | ORAL_TABLET | Freq: Two times a day (BID) | ORAL | 0 refills | Status: DC
Start: 2024-03-16 — End: 2024-05-10

## 2024-03-16 NOTE — Progress Notes (Signed)
 BP 101/69   Pulse 75   Temp 98 F (36.7 C) (Oral)   Ht 5' 6 (1.676 m)   Wt 175 lb (79.4 kg)   SpO2 98%   BMI 28.25 kg/m    Subjective:    Patient ID: Desiree Chavez, female    DOB: 02/12/1981, 43 y.o.   MRN: [id number]  HPI: Desiree Chavez is a 43 y.o. female  Chief Complaint  Patient presents with   Nasal Congestion   UPPER RESPIRATORY TRACT INFECTION Duration: about a week Worst symptom: congestion Fever: no Cough: no Shortness of breath: no Wheezing: no Chest pain: no Chest tightness: no Chest congestion: no Nasal congestion: yes Runny nose: yes Post nasal drip: yes Sneezing: no Sore throat: yes Swollen glands: no Sinus pressure: yes Headache: yes Face pain: yes Toothache: no Ear pain: yes bilateral Ear pressure: yes bilateral Eyes red/itching:no Eye drainage/crusting: no  Vomiting: no Rash: no Fatigue: yes Sick contacts: no Strep contacts: no  Context: worse Recurrent sinusitis: no Relief with OTC cold/cough medications: no  Treatments attempted: none   Relevant past medical, surgical, family and social history reviewed and updated as indicated. Interim medical history since our last visit reviewed. Allergies and medications reviewed and updated.  Review of Systems  Constitutional:  Positive for fatigue. Negative for activity change, appetite change, chills, diaphoresis, fever and unexpected weight change.  HENT:  Positive for congestion, ear pain, postnasal drip, rhinorrhea, sinus pressure, sinus pain and sore throat. Negative for dental problem, drooling, ear discharge, facial swelling, hearing loss, mouth sores, nosebleeds, sneezing, tinnitus, trouble swallowing and voice change.   Eyes: Negative.   Cardiovascular: Negative.   Gastrointestinal: Negative.   Psychiatric/Behavioral: Negative.      Per HPI unless specifically indicated above     Objective:    BP 101/69   Pulse 75   Temp 98 F (36.7 C) (Oral)   Ht 5' 6 (1.676  m)   Wt 175 lb (79.4 kg)   SpO2 98%   BMI 28.25 kg/m   Wt Readings from Last 3 Encounters:  03/16/24 175 lb (79.4 kg)  12/14/23 178 lb 12.8 oz (81.1 kg)  09/08/23 172 lb 12.8 oz (78.4 kg)    Physical Exam Vitals and nursing note reviewed.  Constitutional:      General: She is not in acute distress.    Appearance: Normal appearance. She is normal weight. She is not ill-appearing, toxic-appearing or diaphoretic.  HENT:     Head: Normocephalic and atraumatic.     Right Ear: Tympanic membrane, ear canal and external ear normal.     Left Ear: Tympanic membrane, ear canal and external ear normal.     Nose: Congestion and rhinorrhea present.     Mouth/Throat:     Mouth: Mucous membranes are moist.     Pharynx: Oropharynx is clear. No oropharyngeal exudate or posterior oropharyngeal erythema.  Eyes:     General: No scleral icterus.       Right eye: No discharge.        Left eye: No discharge.     Extraocular Movements: Extraocular movements intact.     Conjunctiva/sclera: Conjunctivae normal.     Pupils: Pupils are equal, round, and reactive to light.  Cardiovascular:     Rate and Rhythm: Normal rate and regular rhythm.     Pulses: Normal pulses.     Heart sounds: Normal heart sounds. No murmur heard.    No friction rub. No gallop.  Pulmonary:  Effort: Pulmonary effort is normal. No respiratory distress.     Breath sounds: Normal breath sounds. No stridor. No wheezing, rhonchi or rales.  Chest:     Chest wall: No tenderness.  Musculoskeletal:        General: Normal range of motion.     Cervical back: Normal range of motion and neck supple.  Skin:    General: Skin is warm and dry.     Capillary Refill: Capillary refill takes less than 2 seconds.     Coloration: Skin is not jaundiced or pale.     Findings: No bruising, erythema, lesion or rash.  Neurological:     General: No focal deficit present.     Mental Status: She is alert and oriented to person, place, and time.  Mental status is at baseline.  Psychiatric:        Mood and Affect: Mood normal.        Behavior: Behavior normal.        Thought Content: Thought content normal.        Judgment: Judgment normal.     Results for orders placed or performed in visit on 08/06/23  Cytology - PAP   Collection Time: 08/06/23 11:08 AM  Result Value Ref Range   High risk HPV Negative    Adequacy      Satisfactory for evaluation; transformation zone component PRESENT.   Diagnosis (A)     - Atypical squamous cells of undetermined significance (ASC-US )   Comment Normal Reference Range HPV - Negative   Microscopic Examination   Collection Time: 08/06/23 11:15 AM   Urine  Result Value Ref Range   WBC, UA >30 (A) 0 - 5 /hpf   RBC, Urine 0-2 0 - 2 /hpf   Epithelial Cells (non renal) 0-10 0 - 10 /hpf   Bacteria, UA Moderate (A) None seen/Few  Urinalysis, Routine w reflex microscopic   Collection Time: 08/06/23 11:15 AM  Result Value Ref Range   Specific Gravity, UA 1.020 1.005 - 1.030   pH, UA 7.5 5.0 - 7.5   Color, UA Yellow Yellow   Appearance Ur Cloudy (A) Clear   Leukocytes,UA 3+ (A) Negative   Protein,UA 1+ (A) Negative/Trace   Glucose, UA Negative Negative   Ketones, UA Negative Negative   RBC, UA 1+ (A) Negative   Bilirubin, UA Negative Negative   Urobilinogen, Ur 0.2 0.2 - 1.0 mg/dL   Nitrite, UA Negative Negative   Microscopic Examination See below:   CBC with Differential/Platelet   Collection Time: 08/06/23 11:16 AM  Result Value Ref Range   WBC 6.5 3.4 - 10.8 x10E3/uL   RBC 4.32 3.77 - 5.28 x10E6/uL   Hemoglobin 12.6 11.1 - 15.9 g/dL   Hematocrit 61.0 65.9 - 46.6 %   MCV 90 79 - 97 fL   MCH 29.2 26.6 - 33.0 pg   MCHC 32.4 31.5 - 35.7 g/dL   RDW 86.8 88.2 - 84.5 %   Platelets 298 150 - 450 x10E3/uL   Neutrophils 65 Not Estab. %   Lymphs 23 Not Estab. %   Monocytes 9 Not Estab. %   Eos 2 Not Estab. %   Basos 1 Not Estab. %   Neutrophils Absolute 4.3 1.4 - 7.0 x10E3/uL    Lymphocytes Absolute 1.5 0.7 - 3.1 x10E3/uL   Monocytes Absolute 0.6 0.1 - 0.9 x10E3/uL   EOS (ABSOLUTE) 0.1 0.0 - 0.4 x10E3/uL   Basophils Absolute 0.0 0.0 - 0.2 x10E3/uL   Immature Granulocytes  0 Not Estab. %   Immature Grans (Abs) 0.0 0.0 - 0.1 x10E3/uL  Comprehensive metabolic panel   Collection Time: 08/06/23 11:16 AM  Result Value Ref Range   Glucose 86 70 - 99 mg/dL   BUN 17 6 - 24 mg/dL   Creatinine, Ser 9.28 0.57 - 1.00 mg/dL   eGFR 892 >40 fO/fpw/8.26   BUN/Creatinine Ratio 24 (H) 9 - 23   Sodium 139 134 - 144 mmol/L   Potassium 4.1 3.5 - 5.2 mmol/L   Chloride 102 96 - 106 mmol/L   CO2 23 20 - 29 mmol/L   Calcium 9.4 8.7 - 10.2 mg/dL   Total Protein 7.0 6.0 - 8.5 g/dL   Albumin 4.4 3.9 - 4.9 g/dL   Globulin, Total 2.6 1.5 - 4.5 g/dL   Bilirubin Total 0.8 0.0 - 1.2 mg/dL   Alkaline Phosphatase 56 44 - 121 IU/L   AST 11 0 - 40 IU/L   ALT 12 0 - 32 IU/L  Lipid Panel w/o Chol/HDL Ratio   Collection Time: 08/06/23 11:16 AM  Result Value Ref Range   Cholesterol, Total 192 100 - 199 mg/dL   Triglycerides 72 0 - 149 mg/dL   HDL 53 >60 mg/dL   VLDL Cholesterol Cal 13 5 - 40 mg/dL   LDL Chol Calc (NIH) 873 (H) 0 - 99 mg/dL  TSH   Collection Time: 08/06/23 11:16 AM  Result Value Ref Range   TSH 1.380 0.450 - 4.500 uIU/mL      Assessment & Plan:   Problem List Items Addressed This Visit   None Visit Diagnoses       Nasal congestion    -  Primary   Will treat with prednisone . If not significantly better by Sunday will start augmentin . Call with any concerns. Continue to monitor.        Follow up plan: Return for As scheduled.

## 2024-03-24 DIAGNOSIS — Z419 Encounter for procedure for purposes other than remedying health state, unspecified: Secondary | ICD-10-CM | POA: Diagnosis not present

## 2024-04-23 DIAGNOSIS — Z419 Encounter for procedure for purposes other than remedying health state, unspecified: Secondary | ICD-10-CM | POA: Diagnosis not present

## 2024-05-10 ENCOUNTER — Ambulatory Visit: Payer: Self-pay | Admitting: Pediatrics

## 2024-05-10 ENCOUNTER — Ambulatory Visit: Admitting: Pediatrics

## 2024-05-10 ENCOUNTER — Encounter: Payer: Self-pay | Admitting: Pediatrics

## 2024-05-10 VITALS — BP 92/63 | HR 94 | Temp 98.8°F | Resp 15 | Ht 65.98 in | Wt 172.2 lb

## 2024-05-10 DIAGNOSIS — J301 Allergic rhinitis due to pollen: Secondary | ICD-10-CM | POA: Diagnosis not present

## 2024-05-10 DIAGNOSIS — J069 Acute upper respiratory infection, unspecified: Secondary | ICD-10-CM | POA: Diagnosis not present

## 2024-05-10 DIAGNOSIS — J0141 Acute recurrent pansinusitis: Secondary | ICD-10-CM | POA: Diagnosis not present

## 2024-05-10 LAB — POC COVID19/FLU A&B COMBO
Covid Antigen, POC: NEGATIVE
Influenza A Antigen, POC: NEGATIVE
Influenza B Antigen, POC: NEGATIVE

## 2024-05-10 LAB — POCT RAPID STREP A (OFFICE): Rapid Strep A Screen: NEGATIVE

## 2024-05-10 MED ORDER — AMOXICILLIN-POT CLAVULANATE 875-125 MG PO TABS: 1.0000 | ORAL_TABLET | Freq: Two times a day (BID) | ORAL | 0 refills | Status: DC

## 2024-05-10 NOTE — Progress Notes (Signed)
 Office Visit  BP 92/63 (BP Location: Left Arm, Patient Position: Sitting, Cuff Size: Normal)   Pulse 94   Temp 98.8 F (37.1 C) (Oral)   Resp 15   Ht 5' 5.98 (1.676 m)   Wt 172 lb 3.2 oz (78.1 kg)   LMP  (LMP Unknown)   SpO2 99%   BMI 27.81 kg/m    Subjective:    Patient ID: Desiree Chavez, female    DOB: 1981-04-04, 43 y.o.   MRN: [id number]  HPI: Desiree Chavez is a 43 y.o. female  Chief Complaint  Patient presents with   Sore Throat    Started last week. Sore throat, sinus draining, facial pressure, jaw pain and ear pressure. Runny nose at night and congestion in the morning. No fevers.     Discussed the use of AI scribe software for clinical note transcription with the patient, who gave verbal consent to proceed.  History of Present Illness   Desiree Chavez is a 43 year old female who presents with cold-like symptoms and ear pressure.  Symptoms began last week and include sore throat, congestion, and pressure in the ears and jaw. She continued to work through the weekend despite these symptoms. The pressure was particularly noticeable last night, extending to the jaw and ears, which she described as 'a lot of pressure'.  She recalls experiencing similar symptoms back in August, particularly the pressure, which she found to be quite miserable. She is concerned that the symptoms are starting up again.  No significant issues with breathing and only a minor cough, which she describes as 'nothing' compared to previous experiences.        Relevant past medical, surgical, family and social history reviewed and updated as indicated. Interim medical history since our last visit reviewed. Allergies and medications reviewed and updated.  ROS per HPI unless specifically indicated above     Objective:    BP 92/63 (BP Location: Left Arm, Patient Position: Sitting, Cuff Size: Normal)   Pulse 94   Temp 98.8 F (37.1 C) (Oral)   Resp 15   Ht 5' 5.98 (1.676 m)    Wt 172 lb 3.2 oz (78.1 kg)   LMP  (LMP Unknown)   SpO2 99%   BMI 27.81 kg/m   Wt Readings from Last 3 Encounters:  05/10/24 172 lb 3.2 oz (78.1 kg)  03/16/24 175 lb (79.4 kg)  12/14/23 178 lb 12.8 oz (81.1 kg)     Physical Exam Constitutional:      Appearance: Normal appearance.  HENT:     Right Ear: Tympanic membrane and ear canal normal.     Left Ear: Tympanic membrane and ear canal normal.     Nose:     Right Turbinates: Enlarged.     Left Turbinates: Enlarged.     Right Sinus: Maxillary sinus tenderness and frontal sinus tenderness present.     Left Sinus: Maxillary sinus tenderness and frontal sinus tenderness present.     Mouth/Throat:     Pharynx: No posterior oropharyngeal erythema.     Tonsils: No tonsillar exudate or tonsillar abscesses.  Cardiovascular:     Rate and Rhythm: Normal rate and regular rhythm.  Pulmonary:     Effort: Pulmonary effort is normal. No respiratory distress.     Breath sounds: Normal breath sounds. No wheezing.  Musculoskeletal:        General: Normal range of motion.  Skin:    Comments: Normal skin color  Neurological:  General: No focal deficit present.     Mental Status: She is alert. Mental status is at baseline.  Psychiatric:        Mood and Affect: Mood normal.        Behavior: Behavior normal.        Thought Content: Thought content normal.         05/10/2024    9:25 AM 03/16/2024    1:51 PM 12/14/2023   11:20 AM 08/06/2023   10:42 AM 04/15/2022   11:37 AM  Depression screen PHQ 2/9  Decreased Interest 0 0 0 0 0  Down, Depressed, Hopeless 0 0 0 0 0  PHQ - 2 Score 0 0 0 0 0  Altered sleeping 0 0 0 0 0  Tired, decreased energy 0 0 0 0 0  Change in appetite 0 0 0 0 0  Feeling bad or failure about yourself  0 0 0 0 0  Trouble concentrating 0 0 0 0 0  Moving slowly or fidgety/restless 0 0 0 0 0  Suicidal thoughts 0 0 0 0 0  PHQ-9 Score 0 0 0 0 0  Difficult doing work/chores  Not difficult at all  Not difficult at all  Not difficult at all       05/10/2024    9:25 AM 03/16/2024    1:51 PM 12/14/2023   11:20 AM 08/06/2023   10:42 AM  GAD 7 : Generalized Anxiety Score  Nervous, Anxious, on Edge 0 0 0 0  Control/stop worrying 0 0 0 0  Worry too much - different things 0 0 0 0  Trouble relaxing 0 0 0 0  Restless 0 0 0 0  Easily annoyed or irritable 0 0 0 0  Afraid - awful might happen 0 0 0 0  Total GAD 7 Score 0 0 0 0  Anxiety Difficulty  Not difficult at all  Not difficult at all       Assessment & Plan:  Assessment & Plan   Acute recurrent pansinusitis Symptoms suggest possible sinus infection, neg strep, COVID-19, or influenza. Breathing adequate, minimal cough, significant swelling noted in turbinates. Very tender in bilateral sinuses and jaw, plan to treat as recurrent, referred to ENT as well.  -     POC Covid19/Flu A&B Antigen -     POCT rapid strep A -     Amoxicillin -Pot Clavulanate; Take 1 tablet by mouth 2 (two) times daily.  Dispense: 20 tablet; Refill: 0  Seasonal allergic rhinitis due to pollen Recurrent issue above, suboptimal management. Referral to ENT placed.  -     Ambulatory referral to ENT   Follow up plan: Return if symptoms worsen or fail to improve.  Hadassah SHAUNNA Nett, MD    Approximately 30 minutes spent on patient encounter today including assessment, counseling, diagnosing, treatment plan development, and charting.

## 2024-05-10 NOTE — Patient Instructions (Signed)
 Most cold symptoms last up to 2 weeks, but cough can sometimes linger up to 4 weeks.  However if your symtpoms get WORSE - like you develop fevers or get more shortness of breath, then call your clinic as you may need to be evaluated.   Aches and Pains Acetaminophen (Tylenol): 1000mg  ("extra strength" tablets are 500mg , so take 2) every 8 hours if needed  Ibuprofen (Advil/Motrin) 400-800mg  (comes in 200mg  pills OTC, so 2-4 pills) every 8 hours. - Avoid in excess if cardiac blood pressure issues  Sore Throat:  See Aches and Pains meds above, also Sore throat sprays and lozenges may also help.   Cough:  Honey 2 TBS every 4-6 hours if needed.  Robitussin DM syrup or generic equivalent which has (guaifenesin = an expectorant to help you get stuff up + dextromethorphan (DM) = cough supressant). You can also get this in tablet formula (like Mucinex DM or generic equivalent).  If you have asthma or are wheezing and have a tight chest, then albuterol inhaler (Ventolin, ProAir) may be helpful - you need a prescription for this.   Congestion:  oxymetazoline (Afrin) nasal stray: 2 sprays each nostril every 12 hours. Don't use more than 3 days in a row to avoid building a tolerance to it.  Sinus rinse (neti pot) high volume sinus rinse can help open up your sinuses and be helpful, especially if you're having sinus pressure and headaches.   Other:  Umcka (pelargonium sidoides extract) can to shorten cold symptoms (can be hard to find, but [employer] carries it: brand name Umcka ColdCare from [employer]). Works best if you start taking at earliest signs of cold symptoms.  Andrographis paniculata is another herbal remedy with less evidence, but may reduce common cold symptoms in adults.  zinc acetate lozenges >= 80 mg/day reduces duration but not severity of cold symptoms in adults, but it is associated with bad taste and nausea Heated humidified air may reduce cold symptoms, so try using a humidifier -  especially in your bedroom at night.  Stay hydrated! Aim to drink at least 2 liters of water daily.   What doesn't work (but lots of folks think might) Vitamin C: bummer right?! But there's no evidence that high dose vitamin C will help cold symptoms.

## 2024-06-15 ENCOUNTER — Ambulatory Visit (INDEPENDENT_AMBULATORY_CARE_PROVIDER_SITE_OTHER)

## 2024-06-15 DIAGNOSIS — Z23 Encounter for immunization: Secondary | ICD-10-CM | POA: Diagnosis not present

## 2024-06-15 NOTE — Progress Notes (Signed)
 Patient is in office today for a nurse visit for Immunization. Patient Injection was given in the  Left deltoid. Patient tolerated injection well.

## 2024-06-20 ENCOUNTER — Ambulatory Visit: Admitting: Family Medicine

## 2024-06-20 ENCOUNTER — Encounter: Payer: Self-pay | Admitting: Family Medicine

## 2024-06-20 VITALS — BP 88/60 | HR 100 | Temp 97.4°F | Ht 66.0 in | Wt 172.6 lb

## 2024-06-20 DIAGNOSIS — K219 Gastro-esophageal reflux disease without esophagitis: Secondary | ICD-10-CM | POA: Insufficient documentation

## 2024-06-20 DIAGNOSIS — G35D Multiple sclerosis, unspecified: Secondary | ICD-10-CM | POA: Diagnosis not present

## 2024-06-20 DIAGNOSIS — Z1211 Encounter for screening for malignant neoplasm of colon: Secondary | ICD-10-CM

## 2024-06-20 DIAGNOSIS — K5909 Other constipation: Secondary | ICD-10-CM | POA: Insufficient documentation

## 2024-06-20 DIAGNOSIS — N809 Endometriosis, unspecified: Secondary | ICD-10-CM

## 2024-06-20 DIAGNOSIS — J069 Acute upper respiratory infection, unspecified: Secondary | ICD-10-CM

## 2024-06-20 MED ORDER — PREDNISONE 50 MG PO TABS: 50.0000 mg | ORAL_TABLET | Freq: Every day | ORAL | 0 refills | Status: AC

## 2024-06-20 MED ORDER — OMEPRAZOLE 20 MG PO CPDR: 20.0000 mg | DELAYED_RELEASE_CAPSULE | Freq: Every day | ORAL | 3 refills | Status: AC

## 2024-06-20 MED ORDER — POLYETHYLENE GLYCOL 3350 17 GM/SCOOP PO POWD: 17.0000 g | Freq: Two times a day (BID) | ORAL | 1 refills | Status: AC | PRN

## 2024-06-20 NOTE — Assessment & Plan Note (Signed)
 Will start her on miralax  PRN. Will get her colonoscopy. Call with any concerns.

## 2024-06-20 NOTE — Assessment & Plan Note (Signed)
Continue to follow with neurology. Continue to monitor. Call with any concerns.

## 2024-06-20 NOTE — Assessment & Plan Note (Signed)
 Will start on birth control pills and see how she's doing. Follow up in January as scheduled.

## 2024-06-20 NOTE — Assessment & Plan Note (Signed)
 Will start her on omeprazole . Call with any concerns. Referral to GI placed today.

## 2024-06-20 NOTE — Progress Notes (Signed)
 BP (!) 88/60   Pulse 100   Temp (!) 97.4 F (36.3 C) (Oral)   Ht 5' 6 (1.676 m)   Wt 172 lb 9.6 oz (78.3 kg)   SpO2 100%   BMI 27.86 kg/m    Subjective:    Patient ID: Desiree Chavez, female    DOB: Sep 03, 1980, 43 y.o.   MRN: [id number]  HPI: Desiree Chavez is a 43 y.o. female  Chief Complaint  Patient presents with   Tinnitus    Both ears. Onset last week. A little pain    Nasal Congestion    Onset last week. Flu shot 12/4   UPPER RESPIRATORY TRACT INFECTION Duration: 4-5 days Worst symptom: congestion, drainage, tinnitus Fever: no Cough: no Shortness of breath: no Wheezing: no Chest pain: no Chest tightness: no Chest congestion: no Nasal congestion: yes Runny nose: yes Post nasal drip: yes Sneezing: no Sore throat: no Swollen glands: no Sinus pressure: no Headache: no Face pain: no Toothache: no Ear pain: yes, bilateral  Ear pressure: yes  Eyes red/itching:no Eye drainage/crusting: no  Vomiting: no Rash: no Fatigue: yes Sick contacts: yes Strep contacts: no  Context: stable Recurrent sinusitis: no Relief with OTC cold/cough medications: no  Treatments attempted: pseudoephedrine   Has been under a lot of stress with caring for her mother her job is alos ending at the end of the year. She notes that the stress is also effecting her MS and making it worse.   Went to see GYN and they were going to start her on some OCP for her endometriosis- she didn't start it. She has not followed up with them. Her periods remain irregular.   She has been really constipated- she has only been taking colace. Sometimes has abdominal pain. Has been having worse heartburn. Only having BM 1x every 1-2 weeks. No blood in her stool. No other concerns or complaints at this time.   Relevant past medical, surgical, family and social history reviewed and updated as indicated. Interim medical history since our last visit reviewed. Allergies and medications reviewed and  updated.  Review of Systems  Constitutional:  Positive for fatigue. Negative for activity change, appetite change, chills, diaphoresis, fever and unexpected weight change.  HENT:  Positive for congestion, ear pain, postnasal drip, rhinorrhea, sinus pressure and tinnitus. Negative for dental problem, drooling, ear discharge, facial swelling, hearing loss, mouth sores, nosebleeds, sinus pain, sneezing, sore throat, trouble swallowing and voice change.   Eyes: Negative.   Respiratory: Negative.    Cardiovascular: Negative.   Gastrointestinal:  Positive for abdominal pain and constipation. Negative for abdominal distention, anal bleeding, blood in stool, diarrhea, nausea, rectal pain and vomiting.  Skin: Negative.   Neurological:  Positive for dizziness, speech difficulty, weakness and light-headedness. Negative for tremors, seizures, syncope, facial asymmetry, numbness and headaches.  Psychiatric/Behavioral:  Negative for agitation, behavioral problems, confusion, decreased concentration, dysphoric mood, hallucinations, self-injury, sleep disturbance and suicidal ideas. The patient is nervous/anxious. The patient is not hyperactive.     Per HPI unless specifically indicated above     Objective:    BP (!) 88/60   Pulse 100   Temp (!) 97.4 F (36.3 C) (Oral)   Ht 5' 6 (1.676 m)   Wt 172 lb 9.6 oz (78.3 kg)   SpO2 100%   BMI 27.86 kg/m   Wt Readings from Last 3 Encounters:  06/20/24 172 lb 9.6 oz (78.3 kg)  05/10/24 172 lb 3.2 oz (78.1 kg)  03/16/24 175  lb (79.4 kg)    Physical Exam Vitals and nursing note reviewed.  Constitutional:      General: She is not in acute distress.    Appearance: Normal appearance. She is not ill-appearing, toxic-appearing or diaphoretic.  HENT:     Head: Normocephalic and atraumatic.     Right Ear: External ear normal.     Left Ear: External ear normal.     Nose: Nose normal.     Mouth/Throat:     Mouth: Mucous membranes are moist.     Pharynx:  Oropharynx is clear.  Eyes:     General: No scleral icterus.       Right eye: No discharge.        Left eye: No discharge.     Extraocular Movements: Extraocular movements intact.     Conjunctiva/sclera: Conjunctivae normal.     Pupils: Pupils are equal, round, and reactive to light.  Cardiovascular:     Rate and Rhythm: Normal rate and regular rhythm.     Pulses: Normal pulses.     Heart sounds: Normal heart sounds. No murmur heard.    No friction rub. No gallop.  Pulmonary:     Effort: Pulmonary effort is normal. No respiratory distress.     Breath sounds: Normal breath sounds. No stridor. No wheezing, rhonchi or rales.  Chest:     Chest wall: No tenderness.  Musculoskeletal:        General: Normal range of motion.     Cervical back: Normal range of motion and neck supple.  Skin:    General: Skin is warm and dry.     Capillary Refill: Capillary refill takes less than 2 seconds.     Coloration: Skin is not jaundiced or pale.     Findings: No bruising, erythema, lesion or rash.  Neurological:     General: No focal deficit present.     Mental Status: She is alert and oriented to person, place, and time. Mental status is at baseline.  Psychiatric:        Mood and Affect: Mood normal.        Behavior: Behavior normal.        Thought Content: Thought content normal.        Judgment: Judgment normal.     Results for orders placed or performed in visit on 05/10/24  POC Covid19/Flu A&B Antigen   Collection Time: 05/10/24  9:51 AM  Result Value Ref Range   Influenza A Antigen, POC Negative Negative   Influenza B Antigen, POC Negative Negative   Covid Antigen, POC Negative Negative  POCT rapid strep A   Collection Time: 05/10/24  9:51 AM  Result Value Ref Range   Rapid Strep A Screen Negative Negative      Assessment & Plan:   Problem List Items Addressed This Visit       Digestive   Chronic constipation   Will start her on miralax  PRN. Will get her colonoscopy. Call  with any concerns.       Relevant Medications   polyethylene glycol powder (GLYCOLAX /MIRALAX ) 17 GM/SCOOP powder   Other Relevant Orders   Ambulatory referral to Gastroenterology   Chronic GERD   Will start her on omeprazole . Call with any concerns. Referral to GI placed today.       Relevant Medications   omeprazole  (PRILOSEC) 20 MG capsule   polyethylene glycol powder (GLYCOLAX /MIRALAX ) 17 GM/SCOOP powder   Other Relevant Orders   Ambulatory referral to Gastroenterology  Nervous and Auditory   Multiple sclerosis   Continue to follow with neurology. Continue to monitor. Call with any concerns.         Other   Endometriosis   Will start on birth control pills and see how she's doing. Follow up in January as scheduled.       Other Visit Diagnoses       Upper respiratory tract infection, unspecified type    -  Primary   Will treat with burst of prednisone . Call with any concerns. Continue to monitor. Call with any concerns.     Screening for colon cancer       Referral to GI placed today.   Relevant Orders   Ambulatory referral to Gastroenterology        Follow up plan: Return for As scheduled.

## 2024-08-07 ENCOUNTER — Encounter: Payer: Self-pay | Admitting: Family Medicine
